# Patient Record
Sex: Male | Born: 1945
Health system: Southern US, Community
[De-identification: ages and names within clinical notes are randomized; demographics above are authoritative.]

## PROBLEM LIST (undated history)

## (undated) DIAGNOSIS — R519 Headache, unspecified: Secondary | ICD-10-CM

## (undated) DIAGNOSIS — E7800 Pure hypercholesterolemia, unspecified: Secondary | ICD-10-CM

## (undated) DIAGNOSIS — I10 Essential (primary) hypertension: Secondary | ICD-10-CM

## (undated) DIAGNOSIS — K4090 Unilateral inguinal hernia, without obstruction or gangrene, not specified as recurrent: Secondary | ICD-10-CM

## (undated) DIAGNOSIS — H548 Legal blindness, as defined in USA: Secondary | ICD-10-CM

## (undated) DIAGNOSIS — M1990 Unspecified osteoarthritis, unspecified site: Secondary | ICD-10-CM

---

## 2009-04-21 HISTORY — PX: HERNIA REPAIR: SHX51

## 2012-04-26 DIAGNOSIS — M169 Osteoarthritis of hip, unspecified: Secondary | ICD-10-CM | POA: Diagnosis not present

## 2012-04-26 DIAGNOSIS — Z23 Encounter for immunization: Secondary | ICD-10-CM | POA: Diagnosis not present

## 2012-05-05 DIAGNOSIS — M25559 Pain in unspecified hip: Secondary | ICD-10-CM | POA: Diagnosis not present

## 2012-05-05 DIAGNOSIS — M169 Osteoarthritis of hip, unspecified: Secondary | ICD-10-CM | POA: Diagnosis not present

## 2012-05-06 ENCOUNTER — Encounter (HOSPITAL_COMMUNITY): Payer: Self-pay | Admitting: *Deleted

## 2012-05-06 ENCOUNTER — Other Ambulatory Visit (HOSPITAL_COMMUNITY): Payer: Self-pay | Admitting: Orthopaedic Surgery

## 2012-05-06 ENCOUNTER — Other Ambulatory Visit (HOSPITAL_COMMUNITY): Payer: Self-pay

## 2012-05-06 ENCOUNTER — Encounter (HOSPITAL_COMMUNITY): Payer: Self-pay | Admitting: Pharmacy Technician

## 2012-05-06 NOTE — Progress Notes (Signed)
Need surgery orders for 1-17 surgery asap. thanks

## 2012-05-07 ENCOUNTER — Encounter (HOSPITAL_COMMUNITY): Payer: Self-pay | Admitting: Orthopedic Surgery

## 2012-05-07 ENCOUNTER — Encounter (HOSPITAL_COMMUNITY): Payer: Self-pay | Admitting: Anesthesiology

## 2012-05-07 ENCOUNTER — Inpatient Hospital Stay (HOSPITAL_COMMUNITY)
Admission: RE | Admit: 2012-05-07 | Discharge: 2012-05-10 | DRG: 470 | Disposition: A | Discharge status: Home Health | Payer: Medicare Other | Source: Ambulatory Visit | Attending: Orthopaedic Surgery | Admitting: Orthopaedic Surgery

## 2012-05-07 ENCOUNTER — Inpatient Hospital Stay (HOSPITAL_COMMUNITY): Payer: Medicare Other

## 2012-05-07 ENCOUNTER — Encounter (HOSPITAL_COMMUNITY): Payer: Self-pay | Admitting: *Deleted

## 2012-05-07 ENCOUNTER — Encounter (HOSPITAL_COMMUNITY)
Admission: RE | Disposition: A | Discharge status: Home Health | Payer: Self-pay | Source: Ambulatory Visit | Attending: Orthopaedic Surgery

## 2012-05-07 ENCOUNTER — Inpatient Hospital Stay (HOSPITAL_COMMUNITY): Payer: Medicare Other | Admitting: Anesthesiology

## 2012-05-07 DIAGNOSIS — Z01818 Encounter for other preprocedural examination: Secondary | ICD-10-CM | POA: Diagnosis not present

## 2012-05-07 DIAGNOSIS — M169 Osteoarthritis of hip, unspecified: Secondary | ICD-10-CM

## 2012-05-07 DIAGNOSIS — Z471 Aftercare following joint replacement surgery: Secondary | ICD-10-CM | POA: Diagnosis not present

## 2012-05-07 DIAGNOSIS — M1610 Unilateral primary osteoarthritis, unspecified hip: Principal | ICD-10-CM | POA: Diagnosis present

## 2012-05-07 DIAGNOSIS — D62 Acute posthemorrhagic anemia: Secondary | ICD-10-CM | POA: Diagnosis not present

## 2012-05-07 DIAGNOSIS — E7800 Pure hypercholesterolemia, unspecified: Secondary | ICD-10-CM | POA: Diagnosis present

## 2012-05-07 DIAGNOSIS — I10 Essential (primary) hypertension: Secondary | ICD-10-CM | POA: Diagnosis not present

## 2012-05-07 DIAGNOSIS — M25559 Pain in unspecified hip: Secondary | ICD-10-CM | POA: Diagnosis not present

## 2012-05-07 DIAGNOSIS — Z96649 Presence of unspecified artificial hip joint: Secondary | ICD-10-CM | POA: Diagnosis not present

## 2012-05-07 HISTORY — PX: TOTAL HIP ARTHROPLASTY: SHX124

## 2012-05-07 HISTORY — DX: Essential (primary) hypertension: I10

## 2012-05-07 HISTORY — DX: Unspecified osteoarthritis, unspecified site: M19.90

## 2012-05-07 HISTORY — DX: Unilateral inguinal hernia, without obstruction or gangrene, not specified as recurrent: K40.90

## 2012-05-07 HISTORY — DX: Pure hypercholesterolemia, unspecified: E78.00

## 2012-05-07 LAB — SURGICAL PCR SCREEN
MRSA, PCR: NEGATIVE
Staphylococcus aureus: NEGATIVE

## 2012-05-07 LAB — URINALYSIS, ROUTINE W REFLEX MICROSCOPIC
Glucose, UA: NEGATIVE mg/dL
Hgb urine dipstick: NEGATIVE
Protein, ur: NEGATIVE mg/dL
Specific Gravity, Urine: 1.03 (ref 1.005–1.030)

## 2012-05-07 LAB — CBC
HCT: 41.8 % (ref 39.0–52.0)
Hemoglobin: 14.4 g/dL (ref 13.0–17.0)
MCV: 90.1 fL (ref 78.0–100.0)
RBC: 4.64 MIL/uL (ref 4.22–5.81)
RDW: 12.8 % (ref 11.5–15.5)
WBC: 6.9 10*3/uL (ref 4.0–10.5)

## 2012-05-07 LAB — ABO/RH: ABO/RH(D): O NEG

## 2012-05-07 LAB — BASIC METABOLIC PANEL
CO2: 26 mEq/L (ref 19–32)
Chloride: 101 mEq/L (ref 96–112)
Creatinine, Ser: 0.78 mg/dL (ref 0.50–1.35)
GFR calc Af Amer: >90 mL/min (ref >90)
Potassium: 3.6 mEq/L (ref 3.5–5.1)
Sodium: 137 mEq/L (ref 135–145)

## 2012-05-07 LAB — PROTIME-INR: INR: 1.03 (ref 0.00–1.49)

## 2012-05-07 LAB — APTT: aPTT: 35 seconds (ref 24–37)

## 2012-05-07 SURGERY — ARTHROPLASTY, HIP, TOTAL, ANTERIOR APPROACH
Anesthesia: General | Site: Hip | Laterality: Left | Wound class: Clean

## 2012-05-07 MED ORDER — OXYCODONE HCL 5 MG PO TABS
5.0000 mg | ORAL_TABLET | ORAL | Status: DC | PRN
  Administered 2012-05-07 – 2012-05-08 (×4): 10 mg via ORAL
  Administered 2012-05-09: 5 mg via ORAL
  Filled 2012-05-07: qty 1
  Filled 2012-05-07: qty 2
  Filled 2012-05-07 (×2): qty 1
  Filled 2012-05-07 (×2): qty 2

## 2012-05-07 MED ORDER — HYDROMORPHONE HCL PF 1 MG/ML IJ SOLN: 1.0000 mg | INTRAMUSCULAR | Status: DC | PRN

## 2012-05-07 MED ORDER — METHOCARBAMOL 500 MG PO TABS
500.0000 mg | ORAL_TABLET | Freq: Four times a day (QID) | ORAL | Status: DC | PRN
  Administered 2012-05-08 – 2012-05-09 (×2): 500 mg via ORAL
  Filled 2012-05-07 (×2): qty 1

## 2012-05-07 MED ORDER — MIDAZOLAM HCL 5 MG/5ML IJ SOLN
INTRAMUSCULAR | Status: DC | PRN
  Administered 2012-05-07: 2 mg via INTRAVENOUS

## 2012-05-07 MED ORDER — LACTATED RINGERS IV SOLN
INTRAVENOUS | Status: DC | PRN
  Administered 2012-05-07 (×2): via INTRAVENOUS

## 2012-05-07 MED ORDER — DOCUSATE SODIUM 100 MG PO CAPS
100.0000 mg | ORAL_CAPSULE | Freq: Two times a day (BID) | ORAL | Status: DC
  Administered 2012-05-07 – 2012-05-09 (×5): 100 mg via ORAL

## 2012-05-07 MED ORDER — MENTHOL 3 MG MT LOZG: 1.0000 | LOZENGE | OROMUCOSAL | Status: DC | PRN

## 2012-05-07 MED ORDER — ZOLPIDEM TARTRATE 5 MG PO TABS: 5.0000 mg | ORAL_TABLET | Freq: Every evening | ORAL | Status: DC | PRN

## 2012-05-07 MED ORDER — ONDANSETRON HCL 4 MG/2ML IJ SOLN
INTRAMUSCULAR | Status: DC | PRN
  Administered 2012-05-07: 4 mg via INTRAVENOUS

## 2012-05-07 MED ORDER — ACETAMINOPHEN 10 MG/ML IV SOLN: 1000.0000 mg | Freq: Once | INTRAVENOUS | Status: DC | PRN

## 2012-05-07 MED ORDER — HETASTARCH-ELECTROLYTES 6 % IV SOLN
INTRAVENOUS | Status: DC | PRN
  Administered 2012-05-07: 13:00:00 via INTRAVENOUS

## 2012-05-07 MED ORDER — FERROUS SULFATE 325 (65 FE) MG PO TABS
325.0000 mg | ORAL_TABLET | Freq: Three times a day (TID) | ORAL | Status: DC
  Administered 2012-05-07 – 2012-05-10 (×8): 325 mg via ORAL
  Filled 2012-05-07 (×11): qty 1

## 2012-05-07 MED ORDER — METOPROLOL SUCCINATE ER 50 MG PO TB24
50.0000 mg | ORAL_TABLET | Freq: Every day | ORAL | Status: DC
  Administered 2012-05-08 – 2012-05-09 (×2): 50 mg via ORAL
  Filled 2012-05-07 (×3): qty 1

## 2012-05-07 MED ORDER — FENTANYL CITRATE 0.05 MG/ML IJ SOLN
INTRAMUSCULAR | Status: DC | PRN
  Administered 2012-05-07: 50 ug via INTRAVENOUS
  Administered 2012-05-07: 100 ug via INTRAVENOUS
  Administered 2012-05-07 (×2): 50 ug via INTRAVENOUS

## 2012-05-07 MED ORDER — PROPOFOL 10 MG/ML IV BOLUS
INTRAVENOUS | Status: DC | PRN
  Administered 2012-05-07: 150 mg via INTRAVENOUS

## 2012-05-07 MED ORDER — MEPERIDINE HCL 50 MG/ML IJ SOLN: 6.2500 mg | INTRAMUSCULAR | Status: DC | PRN

## 2012-05-07 MED ORDER — OXYCODONE HCL 5 MG/5ML PO SOLN
5.0000 mg | Freq: Once | ORAL | Status: DC | PRN
  Filled 2012-05-07: qty 5

## 2012-05-07 MED ORDER — ACETAMINOPHEN 10 MG/ML IV SOLN
INTRAVENOUS | Status: DC | PRN
  Administered 2012-05-07: 1000 mg via INTRAVENOUS

## 2012-05-07 MED ORDER — DIPHENHYDRAMINE HCL 12.5 MG/5ML PO ELIX: 12.5000 mg | ORAL_SOLUTION | ORAL | Status: DC | PRN

## 2012-05-07 MED ORDER — OXYCODONE HCL ER 20 MG PO T12A
20.0000 mg | EXTENDED_RELEASE_TABLET | Freq: Two times a day (BID) | ORAL | Status: DC
  Administered 2012-05-07 – 2012-05-10 (×6): 20 mg via ORAL
  Filled 2012-05-07 (×6): qty 1

## 2012-05-07 MED ORDER — ALUM & MAG HYDROXIDE-SIMETH 200-200-20 MG/5ML PO SUSP: 30.0000 mL | ORAL | Status: DC | PRN

## 2012-05-07 MED ORDER — ACETAMINOPHEN 325 MG PO TABS: 650.0000 mg | ORAL_TABLET | Freq: Four times a day (QID) | ORAL | Status: DC | PRN

## 2012-05-07 MED ORDER — PHENOL 1.4 % MT LIQD: 1.0000 | OROMUCOSAL | Status: DC | PRN

## 2012-05-07 MED ORDER — ROCURONIUM BROMIDE 100 MG/10ML IV SOLN
INTRAVENOUS | Status: DC | PRN
  Administered 2012-05-07: 10 mg via INTRAVENOUS
  Administered 2012-05-07: 40 mg via INTRAVENOUS

## 2012-05-07 MED ORDER — ONDANSETRON HCL 4 MG PO TABS: 4.0000 mg | ORAL_TABLET | Freq: Four times a day (QID) | ORAL | Status: DC | PRN

## 2012-05-07 MED ORDER — SODIUM CHLORIDE 0.9 % IV SOLN
INTRAVENOUS | Status: DC | PRN
  Administered 2012-05-07: 1000 mL

## 2012-05-07 MED ORDER — PROMETHAZINE HCL 25 MG/ML IJ SOLN: 6.2500 mg | INTRAMUSCULAR | Status: DC | PRN

## 2012-05-07 MED ORDER — SODIUM CHLORIDE 0.9 % IV SOLN
INTRAVENOUS | Status: DC
  Administered 2012-05-07: 75 mL/h via INTRAVENOUS
  Administered 2012-05-08 (×2): via INTRAVENOUS

## 2012-05-07 MED ORDER — METOCLOPRAMIDE HCL 10 MG PO TABS: 5.0000 mg | ORAL_TABLET | Freq: Three times a day (TID) | ORAL | Status: DC | PRN

## 2012-05-07 MED ORDER — CEFAZOLIN SODIUM-DEXTROSE 2-3 GM-% IV SOLR
2.0000 g | INTRAVENOUS | Status: AC
  Administered 2012-05-07: 2 g via INTRAVENOUS

## 2012-05-07 MED ORDER — ONDANSETRON HCL 4 MG/2ML IJ SOLN
4.0000 mg | Freq: Four times a day (QID) | INTRAMUSCULAR | Status: DC | PRN
  Administered 2012-05-08: 4 mg via INTRAVENOUS
  Filled 2012-05-07: qty 2

## 2012-05-07 MED ORDER — ACETAMINOPHEN 650 MG RE SUPP: 650.0000 mg | Freq: Four times a day (QID) | RECTAL | Status: DC | PRN

## 2012-05-07 MED ORDER — STERILE WATER FOR IRRIGATION IR SOLN
Status: DC | PRN
  Administered 2012-05-07: 3000 mL

## 2012-05-07 MED ORDER — CEFAZOLIN SODIUM-DEXTROSE 2-3 GM-% IV SOLR
2.0000 g | Freq: Four times a day (QID) | INTRAVENOUS | Status: AC
  Administered 2012-05-07 – 2012-05-08 (×2): 2 g via INTRAVENOUS
  Filled 2012-05-07 (×2): qty 50

## 2012-05-07 MED ORDER — HYDROMORPHONE HCL PF 1 MG/ML IJ SOLN: 0.2500 mg | INTRAMUSCULAR | Status: DC | PRN

## 2012-05-07 MED ORDER — METHOCARBAMOL 100 MG/ML IJ SOLN
500.0000 mg | Freq: Four times a day (QID) | INTRAVENOUS | Status: DC | PRN
  Filled 2012-05-07: qty 5

## 2012-05-07 MED ORDER — MUPIROCIN 2 % EX OINT
TOPICAL_OINTMENT | Freq: Two times a day (BID) | CUTANEOUS | Status: DC
  Administered 2012-05-07: 1 via NASAL
  Filled 2012-05-07: qty 22

## 2012-05-07 MED ORDER — ASPIRIN EC 325 MG PO TBEC
325.0000 mg | DELAYED_RELEASE_TABLET | Freq: Every day | ORAL | Status: DC
  Administered 2012-05-08 – 2012-05-10 (×3): 325 mg via ORAL
  Filled 2012-05-07 (×4): qty 1

## 2012-05-07 MED ORDER — OXYCODONE HCL 5 MG PO TABS: 5.0000 mg | ORAL_TABLET | Freq: Once | ORAL | Status: DC | PRN

## 2012-05-07 MED ORDER — LIDOCAINE HCL (CARDIAC) 20 MG/ML IV SOLN
INTRAVENOUS | Status: DC | PRN
  Administered 2012-05-07: 100 mg via INTRAVENOUS

## 2012-05-07 MED ORDER — METOCLOPRAMIDE HCL 5 MG/ML IJ SOLN: 5.0000 mg | Freq: Three times a day (TID) | INTRAMUSCULAR | Status: DC | PRN

## 2012-05-07 SURGICAL SUPPLY — 38 items
BAG ZIPLOCK 12X15 (MISCELLANEOUS) ×4 IMPLANT
BLADE SAW SGTL 18X1.27X75 (BLADE) ×2 IMPLANT
CLOTH BEACON ORANGE TIMEOUT ST (SAFETY) ×2 IMPLANT
DERMABOND ADVANCED (GAUZE/BANDAGES/DRESSINGS) ×1
DERMABOND ADVANCED .7 DNX12 (GAUZE/BANDAGES/DRESSINGS) ×1 IMPLANT
DRAPE C-ARM 42X72 X-RAY (DRAPES) ×2 IMPLANT
DRAPE STERI IOBAN 125X83 (DRAPES) ×2 IMPLANT
DRAPE U-SHAPE 47X51 STRL (DRAPES) ×6 IMPLANT
DRSG AQUACEL AG ADV 3.5X10 (GAUZE/BANDAGES/DRESSINGS) ×2 IMPLANT
DURAPREP 26ML APPLICATOR (WOUND CARE) ×2 IMPLANT
ELECT BLADE TIP CTD 4 INCH (ELECTRODE) ×2 IMPLANT
ELECT REM PT RETURN 9FT ADLT (ELECTROSURGICAL) ×2
ELECTRODE REM PT RTRN 9FT ADLT (ELECTROSURGICAL) ×1 IMPLANT
FACESHIELD LNG OPTICON STERILE (SAFETY) ×8 IMPLANT
GLOVE BIO SURGEON STRL SZ7 (GLOVE) ×2 IMPLANT
GLOVE BIO SURGEON STRL SZ7.5 (GLOVE) ×2 IMPLANT
GLOVE BIOGEL PI IND STRL 7.5 (GLOVE) IMPLANT
GLOVE BIOGEL PI IND STRL 8 (GLOVE) ×1 IMPLANT
GLOVE BIOGEL PI INDICATOR 7.5 (GLOVE)
GLOVE BIOGEL PI INDICATOR 8 (GLOVE) ×1
GLOVE ECLIPSE 7.0 STRL STRAW (GLOVE) ×2 IMPLANT
GOWN STRL REIN XL XLG (GOWN DISPOSABLE) ×4 IMPLANT
HANDPIECE INTERPULSE COAX TIP (DISPOSABLE) ×1
HEAD CERAMIC 36 PLUS 8.5 12 14 (Hips) IMPLANT
KIT BASIN OR (CUSTOM PROCEDURE TRAY) ×2 IMPLANT
PACK TOTAL JOINT (CUSTOM PROCEDURE TRAY) ×2 IMPLANT
PADDING CAST COTTON 6X4 STRL (CAST SUPPLIES) ×2 IMPLANT
SET HNDPC FAN SPRY TIP SCT (DISPOSABLE) ×1 IMPLANT
SPONGE LAP 18X18 X RAY DECT (DISPOSABLE) ×2 IMPLANT
SUT ETHIBOND NAB CT1 #1 30IN (SUTURE) ×4 IMPLANT
SUT MNCRL AB 4-0 PS2 18 (SUTURE) ×2 IMPLANT
SUT VIC AB 1 CT1 36 (SUTURE) ×4 IMPLANT
SUT VIC AB 2-0 CT1 27 (SUTURE) ×2
SUT VIC AB 2-0 CT1 TAPERPNT 27 (SUTURE) ×2 IMPLANT
SUT VLOC 180 0 24IN GS25 (SUTURE) ×2 IMPLANT
TOWEL OR 17X26 10 PK STRL BLUE (TOWEL DISPOSABLE) ×4 IMPLANT
TOWEL OR NON WOVEN STRL DISP B (DISPOSABLE) ×2 IMPLANT
TRAY FOLEY CATH 14FRSI W/METER (CATHETERS) ×2 IMPLANT

## 2012-05-07 NOTE — Anesthesia Postprocedure Evaluation (Signed)
Anesthesia Post Note  Patient: Lee Torres  Procedure(s) Performed: Procedure(s) (LRB): TOTAL HIP ARTHROPLASTY ANTERIOR APPROACH (Left)  Anesthesia type: General  Patient location: PACU  Post pain: Pain level controlled  Post assessment: Post-op Vital signs reviewed  Last Vitals: BP 116/74  Pulse 60  Temp 36.8 C (Oral)  Resp 14  Ht 5\' 10"  (1.778 m)  Wt 212 lb 6 oz (96.333 kg)  BMI 30.47 kg/m2  SpO2 98%  Post vital signs: Reviewed  Level of consciousness: sedated  Complications: No apparent anesthesia complications

## 2012-05-07 NOTE — H&P (Signed)
TOTAL HIP ADMISSION H&P  Patient is admitted for left total hip arthroplasty.  Subjective:  Chief Complaint: left hip pain  HPI: Lee Torres, 67 y.o. male, has a history of pain and functional disability in the left hip(s) due to arthritis and patient has failed non-surgical conservative treatments for greater than 12 weeks to include use of assistive devices, weight reduction as appropriate and activity modification.  Onset of symptoms was gradual starting >10 years ago with gradually worsening course since that time.The patient noted no past surgery on the left hip(s).  Patient currently rates pain in the left hip at 10 out of 10 with activity. Patient has night pain, worsening of pain with activity and weight bearing, trendelenberg gait, pain that interfers with activities of daily living, pain with passive range of motion and crepitus. Patient has evidence of subchondral cysts, subchondral sclerosis, periarticular osteophytes and joint space narrowing by imaging studies. This condition presents safety issues increasing the risk of falls.  There is no current active infection.  Patient Active Problem List   Diagnosis Date Noted  . Degenerative arthritis of hip 05/07/2012   Past Medical History  Diagnosis Date  . Hypertension   . Elevated cholesterol   . Arthritis     oa  . Inguinal hernia     left side, repaired unsuccessfully 2 yrs ago    Past Surgical History  Procedure Date  . Hernia repair 2011    No prescriptions prior to admission   No Known Allergies  History  Substance Use Topics  . Smoking status: Never Smoker   . Smokeless tobacco: Current User  . Alcohol Use: No    History reviewed. No pertinent family history.   Review of Systems  Musculoskeletal: Positive for joint pain.  All other systems reviewed and are negative.    Objective:  Physical Exam  Constitutional: He is oriented to person, place, and time. He appears well-developed and well-nourished.    HENT:  Head: Normocephalic and atraumatic.  Eyes: EOM are normal. Pupils are equal, round, and reactive to light.  Neck: Normal range of motion. Neck supple.  Cardiovascular: Normal rate and regular rhythm.   Respiratory: Effort normal and breath sounds normal.  GI: Soft. Bowel sounds are normal.  Musculoskeletal:       Left hip: He exhibits decreased range of motion, decreased strength, bony tenderness, crepitus and deformity.  Neurological: He is alert and oriented to person, place, and time.  Skin: Skin is warm and dry.  Psychiatric: He has a normal mood and affect.    Vital signs in last 24 hours:    Labs:   There is no height or weight on file to calculate BMI.   Imaging Review Plain radiographs demonstrate severe degenerative joint disease of the left hip(s). The bone quality appears to be good for age and reported activity level.  Assessment/Plan:  End stage arthritis, left hip(s)  The patient history, physical examination, clinical judgement of the provider and imaging studies are consistent with end stage degenerative joint disease of the left hip(s) and total hip arthroplasty is deemed medically necessary. The treatment options including medical management, injection therapy, arthroscopy and arthroplasty were discussed at length. The risks and benefits of total hip arthroplasty were presented and reviewed. The risks due to aseptic loosening, infection, stiffness, dislocation/subluxation,  thromboembolic complications and other imponderables were discussed.  The patient acknowledged the explanation, agreed to proceed with the plan and consent was signed. Patient is being admitted for inpatient treatment for  surgery, pain control, PT, OT, prophylactic antibiotics, VTE prophylaxis, progressive ambulation and ADL's and discharge planning.The patient is planning to be discharged home with home health services

## 2012-05-07 NOTE — Brief Op Note (Signed)
05/07/2012  2:25 PM  PATIENT:  Lee Torres  67 y.o. male  PRE-OPERATIVE DIAGNOSIS:  Severe Osteoarthritis Left Hip  POST-OPERATIVE DIAGNOSIS:  Severe Osteoarthritis Left Hip  PROCEDURE:  Procedure(s) (LRB) with comments: TOTAL HIP ARTHROPLASTY ANTERIOR APPROACH (Left)  SURGEON:  Surgeon(s) and Role:    * Kathryne Hitch, MD - Primary  PHYSICIAN ASSISTANT:   Maud Deed, PA-C  ASSISTANTS: none   ANESTHESIA:   general  EBL:  Total I/O In: 2500 [I.V.:2000; IV Piggyback:500] Out: 1975 [Urine:75; Blood:1900]  BLOOD ADMINISTERED:none  DRAINS: none   LOCAL MEDICATIONS USED:  NONE  SPECIMEN:  No Specimen  DISPOSITION OF SPECIMEN:  N/A  COUNTS:  YES  TOURNIQUET:  * No tourniquets in log *  DICTATION: .Other Dictation: Dictation Number [id number]  PLAN OF CARE: Admit to inpatient   PATIENT DISPOSITION:  PACU - hemodynamically stable.   Delay start of Pharmacological VTE agent (>24hrs) due to surgical blood loss or risk of bleeding: no

## 2012-05-07 NOTE — Anesthesia Preprocedure Evaluation (Addendum)
Anesthesia Evaluation  Patient identified by MRN, date of birth, ID band Patient awake    Reviewed: Allergy & Precautions, H&P , NPO status , Patient's Chart, lab work & pertinent test results  Airway       Dental  (+) Dental Advisory Given, Poor Dentition, Missing and Chipped   Pulmonary neg pulmonary ROS,          Cardiovascular hypertension, Pt. on medications     Neuro/Psych negative neurological ROS  negative psych ROS   GI/Hepatic negative GI ROS, Neg liver ROS,   Endo/Other  negative endocrine ROS  Renal/GU negative Renal ROS     Musculoskeletal  (+) Arthritis -, Osteoarthritis,    Abdominal (+) + obese,   Peds  Hematology negative hematology ROS (+)   Anesthesia Other Findings   Reproductive/Obstetrics                          Anesthesia Physical Anesthesia Plan  ASA: II  Anesthesia Plan:    Post-op Pain Management:    Induction:   Airway Management Planned:   Additional Equipment:   Intra-op Plan:   Post-operative Plan:   Informed Consent: I have reviewed the patients History and Physical, chart, labs and discussed the procedure including the risks, benefits and alternatives for the proposed anesthesia with the patient or authorized representative who has indicated his/her understanding and acceptance.   Dental advisory given  Plan Discussed with: CRNA  Anesthesia Plan Comments:         Anesthesia Quick Evaluation

## 2012-05-07 NOTE — Transfer of Care (Signed)
Immediate Anesthesia Transfer of Care Note  Patient: Lee Torres  Procedure(s) Performed: Procedure(s) (LRB) with comments: TOTAL HIP ARTHROPLASTY ANTERIOR APPROACH (Left)  Patient Location: PACU  Anesthesia Type:General  Level of Consciousness: awake, alert  and oriented  Airway & Oxygen Therapy: Patient Spontanous Breathing and Patient connected to face mask oxygen  Post-op Assessment: Report given to PACU RN and Post -op Vital signs reviewed and stable  Post vital signs: Reviewed and stable  Complications: No apparent anesthesia complications

## 2012-05-08 LAB — CBC
HCT: 30.5 % — ABNORMAL LOW (ref 39.0–52.0)
Hemoglobin: 10.5 g/dL — ABNORMAL LOW (ref 13.0–17.0)
MCH: 30.6 pg (ref 26.0–34.0)
MCV: 88.9 fL (ref 78.0–100.0)
RBC: 3.43 MIL/uL — ABNORMAL LOW (ref 4.22–5.81)
WBC: 8 10*3/uL (ref 4.0–10.5)

## 2012-05-08 LAB — BASIC METABOLIC PANEL
CO2: 30 mEq/L (ref 19–32)
Calcium: 8.1 mg/dL — ABNORMAL LOW (ref 8.4–10.5)
Chloride: 99 mEq/L (ref 96–112)
Creatinine, Ser: 0.84 mg/dL (ref 0.50–1.35)
Glucose, Bld: 113 mg/dL — ABNORMAL HIGH (ref 70–99)

## 2012-05-08 LAB — TYPE AND SCREEN
Antibody Screen: NEGATIVE
Unit division: 0

## 2012-05-08 MED ORDER — POLYETHYLENE GLYCOL 3350 17 G PO PACK
17.0000 g | PACK | Freq: Two times a day (BID) | ORAL | Status: DC
  Administered 2012-05-08 – 2012-05-09 (×4): 17 g via ORAL

## 2012-05-08 NOTE — Evaluation (Signed)
Physical Therapy Evaluation Patient Details Name: Lee Torres MRN: [id number] DOB: 1945-05-06 Today's Date: 05/08/2012 Time: 3244-0102 PT Time Calculation (min): 30 min  PT Assessment / Plan / Recommendation Clinical Impression  67 yo male s/p L direct anterior THA. On eval pt was Min-Mod assist for mobility. Increased time to complete tasks. Limited by pain. Recommend HHPTand 24 hour supervision initially.     PT Assessment  Patient needs continued PT services    Follow Up Recommendations  Home health PT;Supervision/Assistance - 24 hour    Does the patient have the potential to tolerate intense rehabilitation      Barriers to Discharge        Equipment Recommendations  None recommended by PT    Recommendations for Other Services OT consult   Frequency 7X/week    Precautions / Restrictions Precautions Precautions: Fall Precaution Comments: Direct anterior approach BUT MD wrote to avoid excessive external rotation Restrictions Weight Bearing Restrictions: No LLE Weight Bearing: Weight bearing as tolerated   Pertinent Vitals/Pain 4/10 at rest; "14" with activity L hip      Mobility  Bed Mobility Bed Mobility: Supine to Sit Supine to Sit: 3: Mod assist Supine to Sit: Patient Percentage: 50% Sitting - Scoot to Edge of Bed: 4: Min guard Details for Bed Mobility Assistance: VCs safety, technique, hand placement. Assist for L LE off bed and trunk to upright. Increased time. Utilized bedpad to assist with scooting, positioning. Transfers Transfers: Sit to Stand;Stand to Sit Sit to Stand: 4: Min guard;From elevated surface Stand to Sit: 4: Min assist;To chair/3-in-1;With upper extremity assist;With armrests Details for Transfer Assistance: Highly elevated bed to rise to standing. VCS safety, technique, hand placement. Assist to control descent Ambulation/Gait Ambulation/Gait Assistance: 4: Min guard Ambulation Distance (Feet): 30 Feet Assistive device: Rolling  walker Ambulation/Gait Assistance Details: VCs safety, technique, sequence, posture. Slow gait speed. Fatigues easily and limited by pain.  Gait Pattern: Step-to pattern;Antalgic;Trunk flexed;Decreased stride length;Decreased step length - left    Shoulder Instructions     Exercises     PT Diagnosis: Difficulty walking;Abnormality of gait;Acute pain  PT Problem List: Decreased strength;Decreased range of motion;Decreased activity tolerance;Decreased mobility;Pain;Decreased knowledge of precautions;Decreased knowledge of use of DME PT Treatment Interventions: DME instruction;Gait training;Stair training;Functional mobility training;Therapeutic activities;Therapeutic exercise;Patient/family education   PT Goals Acute Rehab PT Goals PT Goal Formulation: With patient Time For Goal Achievement: 05/15/12 Potential to Achieve Goals: Good Pt will go Supine/Side to Sit: with supervision PT Goal: Supine/Side to Sit - Progress: Goal set today Pt will go Sit to Supine/Side: with supervision PT Goal: Sit to Supine/Side - Progress: Goal set today Pt will go Sit to Stand: with supervision PT Goal: Sit to Stand - Progress: Goal set today Pt will Ambulate: 51 - 150 feet;with supervision;with least restrictive assistive device PT Goal: Ambulate - Progress: Goal set today Pt will Go Up / Down Stairs: 3-5 stairs;with least restrictive assistive device;with rail(s) PT Goal: Up/Down Stairs - Progress: Goal set today  Visit Information  Last PT Received On: 05/08/12 Assistance Needed: +1 PT/OT Co-Evaluation/Treatment: Yes    Subjective Data  Subjective: "This leg hurts.Marland Kitchen...wooooo" Patient Stated Goal: HOme. Less pain   Prior Functioning  Home Living Lives With: Spouse Available Help at Discharge: Family;Available 24 hours/day Type of Home: House Home Access: Stairs to enter [employer] of Steps: 4 Entrance Stairs-Rails: Can reach both Home Layout: One level Bathroom Shower/Tub:  Tub/shower unit;Door [employer] Toilet: Standard Bathroom Accessibility: Yes How Accessible: Accessible via walker Home Adaptive  Equipment: Walker - rolling;Bedside commode/3-in-1;Tub transfer bench;Crutches;Walker - four wheeled Prior Function Level of Independence: Independent with assistive device(s) (crutches/cane) Able to Take Stairs?: Yes Driving: Yes Vocation: Unemployed Communication Communication: No difficulties Dominant Hand: Right    Cognition  Overall Cognitive Status: Appears within functional limits for tasks assessed/performed Arousal/Alertness: Awake/alert Orientation Level: Appears intact for tasks assessed Behavior During Session: [facility] for tasks performed    Extremity/Trunk Assessment Right Upper Extremity Assessment RUE ROM/Strength/Tone: Within functional levels Left Upper Extremity Assessment LUE ROM/Strength/Tone: Within functional levels Right Lower Extremity Assessment RLE ROM/Strength/Tone: [facility] for tasks assessed Left Lower Extremity Assessment LLE ROM/Strength/Tone: Deficits;Unable to fully assess;Due to pain LLE ROM/Strength/Tone Deficits: Limited hip ROM with bed mobility noted Trunk Assessment Trunk Assessment: Normal   Balance    End of Session PT - End of Session Equipment Utilized During Treatment: Gait belt Activity Tolerance: Patient limited by pain;Patient limited by fatigue Patient left: in chair;with call bell/phone within reach;with family/visitor present  GP     Rebeca Alert [facility] 05/08/2012, 12:12 PM [phone]

## 2012-05-08 NOTE — Plan of Care (Signed)
Problem: Phase I Progression Outcomes Goal: Dangle or out of bed evening of surgery Outcome: Not Met (add Reason) Blood transfusion in progress. Will get out of bed with therapy.

## 2012-05-08 NOTE — Progress Notes (Signed)
Pt c/o feeling strong need to void; abd distended over bladder; no drainage through foley. Irrigated w/10cc sterile NS. Immediate return of 500+ cc urine. Pt states feeling better. Harveen Flesch, [employer]

## 2012-05-08 NOTE — Evaluation (Signed)
Occupational Therapy Evaluation Patient Details Name: Lee Torres MRN: [id number] DOB: [birth year] Today's Date: 05/08/2012 Time: 2542-7062 OT Time Calculation (min): 35 min  OT Assessment / Plan / Recommendation Clinical Impression  This 67 yo male s/p left hip direct anterior (avoid excessive external rotation) presents to acute OT with problems below. Will benefit from acute OT without need for follow up.    OT Assessment  Patient needs continued OT Services    Follow Up Recommendations  No OT follow up    Barriers to Discharge None    Equipment Recommendations  None recommended by OT       Frequency  Min 2X/week    Precautions / Restrictions Precautions Precautions: Fall (avoid excessive external rotation) Restrictions Weight Bearing Restrictions: Yes LLE Weight Bearing: Weight bearing as tolerated   Pertinent Vitals/Pain 4/10 at rest, 14/10 with ambulation    ADL  Eating/Feeding: Simulated;Independent Where Assessed - Eating/Feeding: Edge of bed Grooming: Simulated;Set up Where Assessed - Grooming: Unsupported sitting Upper Body Bathing: Simulated;Set up Where Assessed - Upper Body Bathing: Unsupported sitting Lower Body Bathing: Simulated;Maximal assistance Where Assessed - Lower Body Bathing: Supported sit to stand Upper Body Dressing: Simulated;Set up Where Assessed - Upper Body Dressing: Unsupported sitting Lower Body Dressing: Simulated;+1 Total assistance Where Assessed - Lower Body Dressing: Supported sit to stand Toilet Transfer: Simulated;Minimal assistance Toilet Transfer Method: Sit to Barista:  (raised bed, out into hallway) Toileting - Clothing Manipulation and Hygiene: Simulated;Moderate assistance Where Assessed - Toileting Clothing Manipulation and Hygiene: Standing Equipment Used: Rolling walker;Gait belt Transfers/Ambulation Related to ADLs: Min guard A sit to stand from raised bed, Min A stand to sit to recliner,  suspect up from recliner will be a Mod A    OT Diagnosis: Generalized weakness;Acute pain  OT Problem List: Pain;Decreased knowledge of use of DME or AE;Decreased knowledge of precautions;Impaired balance (sitting and/or standing) OT Treatment Interventions: Self-care/ADL training;DME and/or AE instruction;Patient/family education;Balance training   OT Goals Acute Rehab OT Goals OT Goal Formulation: With patient Time For Goal Achievement: 05/15/12 Potential to Achieve Goals: Good ADL Goals Pt Will Transfer to Toilet: with supervision;Ambulation;with DME;3-in-1 ADL Goal: Toilet Transfer - Progress: Goal set today Pt Will Perform Toileting - Clothing Manipulation: with supervision;Standing ADL Goal: Toileting - Clothing Manipulation - Progress: Goal set today Pt Will Perform Toileting - Hygiene: with supervision;Sit to stand from 3-in-1/toilet ADL Goal: Toileting - Hygiene - Progress: Goal set today Pt Will Perform Tub/Shower Transfer: with min assist;Ambulation;with DME;Transfer tub bench ADL Goal: Tub/Shower Transfer - Progress: Goal set today Miscellaneous OT Goals Miscellaneous OT Goal #1: Pt will be Supervision in and OOB for BADLs OT Goal: Miscellaneous Goal #1 - Progress: Goal set today Miscellaneous OT Goal #2: Pt will verbalize and/or return demo the use of AE for LB ADLs OT Goal: Miscellaneous Goal #2 - Progress: Goal set today  Visit Information  Last OT Received On: 05/08/12 Assistance Needed: +1 PT/OT Co-Evaluation/Treatment: Yes (partial)    Subjective Data  Subjective: Whew this hurts   Prior Functioning     Home Living Lives With: Spouse Available Help at Discharge: Family;Available 24 hours/day Type of Home: House Home Access: Stairs to enter [employer] of Steps: 4 Entrance Stairs-Rails: Can reach both Home Layout: One level Bathroom Shower/Tub: Tub/shower unit;Door [employer] Toilet: Standard Bathroom Accessibility: Yes How Accessible:  Accessible via walker Home Adaptive Equipment: Walker - rolling;Bedside commode/3-in-1;Tub transfer bench;Crutches;Walker - four wheeled Prior Function Level of Independence: Independent with assistive device(s) (crutches/cane) Able  to Take Stairs?: Yes Driving: Yes Vocation: Unemployed Communication Communication: No difficulties Dominant Hand: Right            Cognition  Overall Cognitive Status: Appears within functional limits for tasks assessed/performed Arousal/Alertness: Awake/alert Orientation Level: Appears intact for tasks assessed Behavior During Session: [facility] for tasks performed    Extremity/Trunk Assessment Right Upper Extremity Assessment RUE ROM/Strength/Tone: Within functional levels Left Upper Extremity Assessment LUE ROM/Strength/Tone: Within functional levels     Mobility Bed Mobility Bed Mobility: Supine to Sit;Sitting - Scoot to Edge of Bed Supine to Sit: 1: +2 Total assist;HOB flat Supine to Sit: Patient Percentage: 50% Sitting - Scoot to Edge of Bed: 4: Min guard Transfers Transfers: Sit to Stand;Stand to Sit Sit to Stand: 4: Min guard;With upper extremity assist;From elevated surface;From bed Stand to Sit: 4: Min assist;With upper extremity assist;With armrests;To chair/3-in-1 Details for Transfer Assistance: Suspect pt will be mod A from recliner for sit to stand due to pain              End of Session OT - End of Session Equipment Utilized During Treatment: Gait belt (RW) Activity Tolerance: Patient tolerated treatment well Patient left: in chair;with call bell/phone within reach;with family/visitor present Nurse Communication: Mobility status (NT: +2 sit to stand from recliner, +1 once up on feet)       Evette Georges [phone] 05/08/2012, 11:15 AM

## 2012-05-08 NOTE — Op Note (Signed)
Lee Torres, STILTZ NO.:  [id number]  MEDICAL RECORD NO.:  [id number]  LOCATION:  1611                         FACILITY:  [facility]  PHYSICIAN:  Vanita Panda. Magnus Ivan, M.D.DATE OF BIRTH:  1945/10/03  DATE OF PROCEDURE:  05/07/2012 DATE OF DISCHARGE:                              OPERATIVE REPORT   PREOPERATIVE DIAGNOSIS:  End-stage arthritis, degenerative joint disease, left hip.  POSTOPERATIVE DIAGNOSIS:  End-stage arthritis, degenerative joint disease, left hip.  PROCEDURE:  Left total hip arthroplasty through direct anterior approach.  IMPLANTS:  DePuy Sector Gription acetabular component size 56, size 36+ 4 neutral polyethylene liner, size 15 Corail femoral component with varus offset (KLA), size 36+ 12 ceramic hip ball.  SURGEON:  Vanita Panda. Magnus Ivan, M.D.  ASSISTANT:  Wende Neighbors, P.A.  ANESTHESIA:  General.  ANTIBIOTICS:  2 g IV Ancef.  ESTIMATED BLOOD LOSS:  About 1800 mL.  COMPLICATIONS:  None.  INDICATIONS:  Mr. Rodelo is a 67 year old gentleman with severe degenerative joint disease involving his left hip.  It has bothered him about 15 years now.  When I saw him in the office, I could not even rotate his hip around, and x-rays showed complete loss of the joint space with almost essentially no joint space remaining.  He likely had some type of remote trauma, and so he understood this is going to be a quite difficult case, but not impossible.  He is already significantly short on that side as well.  He understood the risks of acute blood loss anemia and likely need for transfusion as well.  Given that he has been on [employer] for a long period of time, which always ends up with bleeding.  PROCEDURE DESCRIPTION:  After informed consent was obtained, appropriate left hip was marked.  He was brought to the operating room, and general anesthesia was obtained while he was on a stretcher.  Foley catheter was placed, and both feet  were placed inline with skeletal traction boots, and he was placed supine on the OSI Hana fracture table.  Both legs were in inline skeletal traction, but no traction applied.  Perineal post was placed as well.  We then prepped the left hip with DuraPrep and sterile drapes.  Time-out was called identifying the correct patient, correct left hip.  We then made an incision inferior and posterior to the anterior superior iliac spine and carried this obliquely down the leg. I dissected down to the tensor fascia lata.  The tensor fascia lata was divided obliquely.  We then proceeded with a direct anterior approach to the hip.  Cobra retractors were placed around the lateral neck and up underneath the rectus femoris.  A medial retractor was placed.  The lateral femoral circumflex vessels were cauterized, and I opened up the hip capsule in an L-shaped format and placed Cobra retractors within the hip capsule.  I then made my femoral neck cut, and I had to make it at the level of the lesser trochanter due to the abundant scar and osteophytes of the head.  Once I cleaned it completely this with an osteotome, it took me considerable amount of time to remove the head out of  the acetabulum.  Once I was able to do this, I placed a bent Hohmann medially and a Cobra retractor laterally.  I cleaned the acetabular debris and then began reaming from size 44 reamer up to a size 56 with all reamers placed under direct visualization.  The last reamer also placed under direct fluoroscopy, so we could obtain my depth of reaming and my inclination and my anteversion.  Once I was pleased with this, I placed the real size 56 DePuy Sector acetabular component and the real 36+ 4 neutral polyethylene liner.  Attention was then turned to the femur.  With the leg externally rotated, extended and adducted, I put a Mueller retractor medially and Hohmann retractor behind the greater trochanter.  I released the lateral  capsule and found abundant scar tissue.  I was able to use a Box cutting guide to open the femoral canal and then began broaching with a size 8 broach all the way to 15 with a size 15 broach even a little proud to fill the canal, but I had to go all the way up to a size 12 hip ball after gaining stability.  I was not comfortable.  I could have gotten more length by placing a tighter stem that was already so tight in the canal.  I was concerned about going up size wise.  Once I was pleased with what we had, we removed all trial components and placed the real Corail femoral component size 15 with varus offset and the real 36+ 12 ceramic hip ball.  We reduced this in the acetabulum.  It did gain motion, it was more stable.  He improved his leg lengths to little too.  I then used pulsatile lavage to copiously irrigate the joint.  I closed the hip capsule with #1 Ethibond suture followed by running 0 V-Loc suture in the tensor fascia lata, 2-0 Vicryl in subcutaneous tissue, and then 4-0 Monocryl and Dermabond for the skin. A well-padded dressing was applied.  He was awakened and extubated and taken to recovery room in stable condition.  All final counts were correct.  There were no complications noted.  Of note, Maud Deed, P.A. was present and assisted in its entirety and the major portions of the case.     Vanita Panda. Magnus Ivan, M.D.     CYB/MEDQ  D:  05/07/2012  T:  05/08/2012  Job:  [id number]

## 2012-05-08 NOTE — Progress Notes (Signed)
Subjective: Pt stable - pain controlled   Objective: Vital signs in last 24 hours: Temp:  [97.1 F (36.2 C)-99.5 F (37.5 C)] 97.1 F (36.2 C) (01/18 0958) Pulse Rate:  [60-86] 80  (01/18 0958) Resp:  [12-16] 16  (01/18 0958) BP: (105-127)/(66-78) 109/66 mmHg (01/18 0958) SpO2:  [94 %-100 %] 95 % (01/18 0958) Weight:  [96.163 kg (212 lb)] 96.163 kg (212 lb) (01/17 1600)  Intake/Output from previous day: 01/17 0701 - 01/18 0700 In: 5189.3 [P.O.:240; I.V.:3823.5; Blood:625.8; IV Piggyback:500] Out: 2375 [Urine:475; Blood:1900] Intake/Output this shift: Total I/O In: 340 [P.O.:240; I.V.:100] Out: 0   Exam:  Neurovascular intact Sensation intact distally Intact pulses distally Dorsiflexion/Plantar flexion intact  Labs:  Basename 05/08/12 0415 05/07/12 1000  HGB 10.5* 14.4    Basename 05/08/12 0415 05/07/12 1000  WBC 8.0 6.9  RBC 3.43* 4.64  HCT 30.5* 41.8  PLT 194 279    Basename 05/08/12 0415 05/07/12 1000  NA 133* 137  K 4.1 3.6  CL 99 101  CO2 30 26  BUN 13 16  CREATININE 0.84 0.78  GLUCOSE 113* 89  CALCIUM 8.1* 9.7    Basename 05/07/12 1000  LABPT --  INR 1.03    Assessment/Plan: Pt doing well - reports knee pain before and after hip surgery - mobilize and possible dc mon - dc foley   Lee Torres 05/08/2012, 10:30 AM

## 2012-05-08 NOTE — Progress Notes (Signed)
UOP = 50cc since 0600 per foley cath. Gaylyn Lambert, MD aware. Both BUN/Creat wnl and HgB 10.6. MD suggests no action, remove Foley in am and force fluids.

## 2012-05-08 NOTE — Progress Notes (Signed)
Physical Therapy Treatment Patient Details Name: Lee Torres MRN: [id number] DOB: 02/08/1946 Today's Date: 05/08/2012 Time: 5638-7564 PT Time Calculation (min): 35 min  PT Assessment / Plan / Recommendation Comments on Treatment Session  Mobility continues to be limited by pain. Pt also c/o leg length difference (has been using 1 shoe on L foot when ambulating).     Follow Up Recommendations  Home health PT;Supervision/Assistance - 24 hour     Does the patient have the potential to tolerate intense rehabilitation     Barriers to Discharge        Equipment Recommendations  None recommended by PT    Recommendations for Other Services OT consult  Frequency 7X/week   Plan Discharge plan remains appropriate    Precautions / Restrictions Precautions Precautions: Fall Restrictions Weight Bearing Restrictions: No LLE Weight Bearing: Weight bearing as tolerated   Pertinent Vitals/Pain Increased pain with activity/ambulation L hip    Mobility  Bed Mobility Bed Mobility: Sit to Supine Sit to Supine: 4: Min assist Details for Bed Mobility Assistance: Assist for L LE onto bed.  Transfers Transfers: Sit to Stand;Stand to Sit Sit to Stand: 4: Min guard;From chair/3-in-1;From elevated surface Stand to Sit: 4: Min assist;To bed;To elevated surface Details for Transfer Assistance: VCS safety, technique, hand placement. Assist to control descent  Ambulation/Gait Ambulation/Gait Assistance: 4: Min guard Ambulation Distance (Feet): 55 Feet Assistive device: Rolling walker Ambulation/Gait Assistance Details: VCs safety, technique, sequence, posture. Slow gait speed. Fatigues easily and limited by pain Gait Pattern: Step-to pattern;Antalgic;Trunk flexed;Decreased stride length;Decreased step length - left    Exercises Total Joint Exercises Ankle Circles/Pumps: AROM;Both;15 reps;Supine Quad Sets: AROM;Both;10 reps;Supine Heel Slides: AAROM;Left;10 reps;Supine Hip ABduction/ADduction:  AAROM;Left;5 reps;Supine   PT Diagnosis:    PT Problem List:   PT Treatment Interventions:     PT Goals Acute Rehab PT Goals Pt will go Sit to Supine/Side: with supervision PT Goal: Sit to Supine/Side - Progress: Progressing toward goal Pt will go Sit to Stand: with supervision PT Goal: Sit to Stand - Progress: Progressing toward goal Pt will Ambulate: 51 - 150 feet;with supervision;with least restrictive assistive device PT Goal: Ambulate - Progress: Progressing toward goal  Visit Information  Last PT Received On: 05/08/12 Assistance Needed: +1    Subjective Data  Subjective: "Im sorry...but this just hurts" Patient Stated Goal: home. Less pain   Cognition  Overall Cognitive Status: Appears within functional limits for tasks assessed/performed Arousal/Alertness: Awake/alert Orientation Level: Appears intact for tasks assessed Behavior During Session: [name] for tasks performed    Balance     End of Session PT - End of Session Equipment Utilized During Treatment: Gait belt Activity Tolerance: Patient limited by pain;Patient limited by fatigue Patient left: in bed;with call bell/phone within reach;with family/visitor present   GP     Rebeca Alert [facility] 05/08/2012, 4:23 PM [phone]

## 2012-05-09 LAB — CBC
HCT: 26.2 % — ABNORMAL LOW (ref 39.0–52.0)
MCH: 30.8 pg (ref 26.0–34.0)
MCV: 89.7 fL (ref 78.0–100.0)
Platelets: 192 10*3/uL (ref 150–400)
RDW: 13.8 % (ref 11.5–15.5)

## 2012-05-09 LAB — URINALYSIS, ROUTINE W REFLEX MICROSCOPIC
Bilirubin Urine: NEGATIVE
Ketones, ur: NEGATIVE mg/dL
Nitrite: NEGATIVE
Specific Gravity, Urine: 1.029 (ref 1.005–1.030)
Urobilinogen, UA: 1 mg/dL (ref 0.0–1.0)
pH: 5.5 (ref 5.0–8.0)

## 2012-05-09 LAB — URINE MICROSCOPIC-ADD ON

## 2012-05-09 MED ORDER — MAGNESIUM CITRATE PO SOLN
1.0000 | Freq: Once | ORAL | Status: AC
  Administered 2012-05-09: 1 via ORAL

## 2012-05-09 NOTE — Progress Notes (Signed)
Subjective: Pt stable - moving well  Objective: Vital signs in last 24 hours: Temp:  [97.1 F (36.2 C)-99.6 F (37.6 C)] 98.6 F (37 C) (01/19 0535) Pulse Rate:  [80-82] 82  (01/19 0535) Resp:  [16-20] 18  (01/19 0720) BP: (109-130)/(66-77) 114/77 mmHg (01/19 0535) SpO2:  [94 %-97 %] 97 % (01/19 0535)  Intake/Output from previous day: 01/18 0701 - 01/19 0700 In: 1582.5 [P.O.:720; I.V.:862.5] Out: 950 [Urine:950] Intake/Output this shift: Total I/O In: 240 [P.O.:240] Out: 15 [Urine:15]  Exam:  Neurovascular intact Sensation intact distally Intact pulses distally  Labs:  Basename 05/09/12 0458 05/08/12 0415 05/07/12 1000  HGB 9.0* 10.5* 14.4    Basename 05/09/12 0458 05/08/12 0415  WBC 9.7 8.0  RBC 2.92* 3.43*  HCT 26.2* 30.5*  PLT 192 194    Basename 05/08/12 0415 05/07/12 1000  NA 133* 137  K 4.1 3.6  CL 99 101  CO2 30 26  BUN 13 16  CREATININE 0.84 0.78  GLUCOSE 113* 89  CALCIUM 8.1* 9.7    Basename 05/07/12 1000  LABPT --  INR 1.03    Assessment/Plan: mag citrate - dc foley   Sheralee Qazi SCOTT 05/09/2012, 9:21 AM

## 2012-05-09 NOTE — Progress Notes (Signed)
Occupational Therapy Treatment Patient Details Name: Lee Torres MRN: [id number] DOB: 10-Jul-1945 Today's Date: 05/09/2012 Time: 4401-0272 OT Time Calculation (min): 45 min  OT Assessment / Plan / Recommendation Comments on Treatment Session Pt needs min cues to avoid external rotation    Follow Up Recommendations  No OT follow up    Barriers to Discharge       Equipment Recommendations  None recommended by OT    Recommendations for Other Services    Frequency     Plan Discharge plan remains appropriate    Precautions / Restrictions Precautions Precautions: Fall Precaution Comments: Direct anterior approach BUT MD wrote to avoid excessive external rotation Restrictions Weight Bearing Restrictions: No LLE Weight Bearing: Weight bearing as tolerated   Pertinent Vitals/Pain No pain in bed or sitting; min pain standing Lhip    ADL  Toilet Transfer: Min guard;Simulated (ambulated around bed to chair) Toilet Transfer Method: Sit to stand Transfers/Ambulation Related to ADLs: Pt got out of bed on L side, like he will do at home.  Min A given.  He ambulated around bed to chair with min guard, min cues for no external rotation.   ADL Comments: Educated on tub bench.  Pt does not want this.  He will wait to step into tub.  Pt does have a 3:1 commode at home.  He didn't need to use this and didn't feel he needed to practice.  Educated on AE:  pt plans to have wife assist.  She is a Lawyer.  Pt requested to stand as it felt good.  He used L shoe during functional ambulation as he L leg feels shorter to him.  Reviewed precaution of no external rotation in context of transfers, ambulation and adls.  Pt verbalizes understanding    OT Diagnosis:    OT Problem List:   OT Treatment Interventions:     OT Goals ADL Goals Pt Will Transfer to Toilet: with supervision;Ambulation;with DME;3-in-1 ADL Goal: Toilet Transfer - Progress: Progressing toward goals Pt Will Perform Tub/Shower Transfer: with  min assist;Ambulation;with DME;Transfer tub bench ADL Goal: Tub/Shower Transfer - Progress: Discontinued (comment) Miscellaneous OT Goals Miscellaneous OT Goal #1: Pt will be Supervision in and OOB for BADLs OT Goal: Miscellaneous Goal #1 - Progress: Progressing toward goals Miscellaneous OT Goal #2: Pt will verbalize and/or return demo the use of AE for LB ADLs OT Goal: Miscellaneous Goal #2 - Progress: Met  Visit Information  Last OT Received On: 05/09/12 Assistance Needed: +1    Subjective Data      Prior Functioning       Cognition  Overall Cognitive Status: Appears within functional limits for tasks assessed/performed Arousal/Alertness: Awake/alert Orientation Level: Appears intact for tasks assessed Behavior During Session: [facility] for tasks performed    Mobility  Shoulder Instructions Bed Mobility Supine to Sit: 4: Min assist Details for Bed Mobility Assistance: assist with LLE Transfers Sit to Stand: 4: Min guard;From chair/3-in-1;From elevated surface;From bed Details for Transfer Assistance: cues for LLE for comfort only       Exercises      Balance     End of Session OT - End of Session Activity Tolerance: Patient tolerated treatment well Patient left: in chair;with call bell/phone within reach;with family/visitor present  GO     Lee Torres 05/09/2012, 9:09 AM Marica Otter, OTR/L [phone] 05/09/2012

## 2012-05-09 NOTE — Progress Notes (Signed)
Physical Therapy Treatment Patient Details Name: Sajad Glander MRN: [id number] DOB: 12-06-1945 Today's Date: 05/09/2012 Time: 7829-5621 PT Time Calculation (min): 23 min  PT Assessment / Plan / Recommendation Comments on Treatment Session  Progressing with mobiilty. Pt preferred to ambulate without shoe on today. Recommend HHPt    Follow Up Recommendations  Home health PT     Does the patient have the potential to tolerate intense rehabilitation     Barriers to Discharge        Equipment Recommendations  None recommended by PT    Recommendations for Other Services OT consult  Frequency 7X/week   Plan Discharge plan remains appropriate    Precautions / Restrictions Precautions Precautions: Fall Precaution Comments: Direct anterior approach BUT MD wrote to avoid excessive external rotation Restrictions Weight Bearing Restrictions: No LLE Weight Bearing: Weight bearing as tolerated   Pertinent Vitals/Pain 7/10 L hip    Mobility  Bed Mobility Transfers Transfers: Sit to Stand;Stand to Sit Sit to Stand: 4: Min guard;From chair/3-in-1 Stand to Sit: 4: Min guard;To chair/3-in-1 Details for Transfer Assistance: VCS L LE placement.  Ambulation/Gait Ambulation Distance (Feet): 100 Feet Assistive device: Rolling walker Ambulation/Gait Assistance Details: Slow gait speed. Pt tolerating ambulation much better on today. Beginning to use reciprocal gait pattern.  Gait Pattern: Step-through pattern;Decreased stride length    Exercises     PT Diagnosis:    PT Problem List:   PT Treatment Interventions:     PT Goals Acute Rehab PT Goals Pt will go Sit to Stand: with supervision PT Goal: Sit to Stand - Progress: Progressing toward goal Pt will Ambulate: 51 - 150 feet;with supervision;with least restrictive assistive device PT Goal: Ambulate - Progress: Progressing toward goal  Visit Information  Last PT Received On: 05/09/12 Assistance Needed: +1    Subjective Data  Subjective: "I'm not hurting as bad as I was yesterday" Patient Stated Goal: home. less pain   Cognition  Overall Cognitive Status: Appears within functional limits for tasks assessed/performed Arousal/Alertness: Awake/alert Orientation Level: Appears intact for tasks assessed Behavior During Session: [facility] for tasks performed    Balance     End of Session PT - End of Session Equipment Utilized During Treatment: Gait belt Activity Tolerance: Patient tolerated treatment well Patient left: in chair;with call bell/phone within reach   GP     Rebeca Alert [facility] 05/09/2012, 10:09 AM [phone]

## 2012-05-09 NOTE — Progress Notes (Signed)
Physical Therapy Treatment Patient Details Name: Lee Torres MRN: [id number] DOB: [birth year] Today's Date: 05/09/2012 Time: 1330-1405 PT Time Calculation (min): 35 min  PT Assessment / Plan / Recommendation Comments on Treatment Session  continuing to progress well. having intermittent bouts of nausea during session today. Needs to practice steps. Recommend HHPT    Follow Up Recommendations  Home health PT     Does the patient have the potential to tolerate intense rehabilitation     Barriers to Discharge        Equipment Recommendations  None recommended by PT    Recommendations for Other Services    Frequency 7X/week   Plan Discharge plan remains appropriate    Precautions / Restrictions Restrictions Weight Bearing Restrictions: No LLE Weight Bearing: Weight bearing as tolerated   Pertinent Vitals/Pain "not much" L hip with activity    Mobility  Transfers Transfers: Sit to Stand;Stand to Sit Sit to Stand: 4: Min guard;From chair/3-in-1;With armrests Stand to Sit: 4: Min guard;To chair/3-in-1;With armrests Ambulation/Gait Ambulation/Gait Assistance: 4: Min guard Ambulation Distance (Feet): 200 Feet Assistive device: Rolling walker Ambulation/Gait Assistance Details: Slow gait speed. Pt tolerating ambulation much better  Gait Pattern: Trunk flexed;Step-through pattern;Decreased stride length    Exercises Total Joint Exercises Ankle Circles/Pumps: AROM;Both;10 reps;Seated Quad Sets: AROM;Both;10 reps;Seated Heel Slides: AAROM;Left;10 reps;Seated Hip ABduction/ADduction: AAROM;Left;10 reps;Seated   PT Diagnosis:    PT Problem List:   PT Treatment Interventions:     PT Goals Acute Rehab PT Goals Pt will go Sit to Stand: with supervision PT Goal: Sit to Stand - Progress: Progressing toward goal Pt will Ambulate: 51 - 150 feet;with supervision;with least restrictive assistive device PT Goal: Ambulate - Progress: Progressing toward goal  Visit Information  Last  PT Received On: 05/09/12 Assistance Needed: +1    Subjective Data  Subjective: "Not much pain" Patient Stated Goal: home.    Cognition  Overall Cognitive Status: Appears within functional limits for tasks assessed/performed Arousal/Alertness: Awake/alert Orientation Level: Appears intact for tasks assessed Behavior During Session: [facility] for tasks performed    Balance     End of Session PT - End of Session Activity Tolerance: Patient tolerated treatment well Patient left: in chair;with call bell/phone within reach;with family/visitor present   GP     Rebeca Alert [facility] 05/09/2012, 2:39 PM [phone]

## 2012-05-09 NOTE — Progress Notes (Signed)
Pt does have sediment and blood in urine - this workup can be started as an outpt - no prior problems with urinary fxn

## 2012-05-10 ENCOUNTER — Encounter (HOSPITAL_COMMUNITY): Payer: Self-pay | Admitting: Orthopaedic Surgery

## 2012-05-10 LAB — CBC
MCV: 89 fL (ref 78.0–100.0)
Platelets: 182 10*3/uL (ref 150–400)
RBC: 2.73 MIL/uL — ABNORMAL LOW (ref 4.22–5.81)
RDW: 13.6 % (ref 11.5–15.5)
WBC: 7 10*3/uL (ref 4.0–10.5)

## 2012-05-10 MED ORDER — METHOCARBAMOL 500 MG PO TABS: 500.0000 mg | ORAL_TABLET | Freq: Four times a day (QID) | ORAL | Status: DC | PRN

## 2012-05-10 MED ORDER — OXYCODONE-ACETAMINOPHEN 5-325 MG PO TABS: 1.0000 | ORAL_TABLET | ORAL | Status: AC | PRN

## 2012-05-10 MED ORDER — ASPIRIN 325 MG PO TBEC: 325.0000 mg | DELAYED_RELEASE_TABLET | Freq: Two times a day (BID) | ORAL | Status: DC

## 2012-05-10 MED ORDER — DSS 100 MG PO CAPS: 100.0000 mg | ORAL_CAPSULE | Freq: Two times a day (BID) | ORAL | Status: DC

## 2012-05-10 MED ORDER — FERROUS SULFATE 325 (65 FE) MG PO TABS: 325.0000 mg | ORAL_TABLET | Freq: Three times a day (TID) | ORAL | Status: DC

## 2012-05-10 NOTE — Progress Notes (Signed)
Physical Therapy Treatment Patient Details Name: Lee Torres MRN: [id number] DOB: [birth year] Today's Date: 05/10/2012 Time: 0902-0928 PT Time Calculation (min): 26 min  PT Assessment / Plan / Recommendation Comments on Treatment Session  POD # 3 THR Direct Anterior THR plans to D/C to home today. Practiced steps.  Instructed on use of ICE.  Instructed on HEP.    Follow Up Recommendations  Home health PT     Does the patient have the potential to tolerate intense rehabilitation     Barriers to Discharge        Equipment Recommendations  None recommended by PT    Recommendations for Other Services    Frequency 7X/week   Plan Discharge plan remains appropriate    Precautions / Restrictions Precautions Precautions: None Precaution Comments: Direct anterior approach  Restrictions Weight Bearing Restrictions: No LLE Weight Bearing: Weight bearing as tolerated   Pertinent Vitals/Pain C/o "soreness" ICE applied    Mobility  Bed Mobility Bed Mobility: Not assessed Details for Bed Mobility Assistance: Pt OOB in recliner Transfers Transfers: Sit to Stand;Stand to Sit Sit to Stand: 6: Modified independent (Device/Increase time) Stand to Sit: 6: Modified independent (Device/Increase time) Details for Transfer Assistance: increased time Ambulation/Gait Ambulation/Gait Assistance: 6: Modified independent (Device/Increase time) Ambulation Distance (Feet): 225 Feet Assistive device: Rolling walker Ambulation/Gait Assistance Details: <25% VC's on safety with turns and backward gait Gait Pattern: Step-through pattern;Decreased stride length Gait velocity: decreased Stairs: Yes Stairs Assistance: 5: Supervision Stairs Assistance Details (indicate cue type and reason): <25% VC's on safety and sequencing Stair Management Technique: One rail Left;With crutches;Forwards Number of Stairs: 4      PT Goals                                      progressing    Visit Information  Last  PT Received On: 05/10/12 Assistance Needed: +1    Subjective Data      Cognition       Balance     End of Session PT - End of Session Equipment Utilized During Treatment: Gait belt Activity Tolerance: Patient tolerated treatment well Patient left: in chair;with call bell/phone within reach   Felecia Shelling  PTA [facility]  Acute  Rehab Pager     [phone]

## 2012-05-10 NOTE — Care Management Note (Signed)
    Page 1 of 1   05/10/2012     2:54:25 PM   CARE MANAGEMENT NOTE 05/10/2012  Patient:  Lee Torres,Lee Torres   Account Number:  [id number]  Date Initiated:  05/10/2012  Documentation initiated by:  Colleen Can  Subjective/Objective Assessment:   dx severe osteoaqrthritis left hip; total hip replacemnt-anterior approach on day of admission.    Per-arranged for [facility] to provide [facility] services upon discharge     Action/Plan:   CM spoke with patient. Plans are for patient to return to his  home in [place], [place] where spouse will be caregiver. He already has RW and commode seat at home. wants HH agency that is in network   Anticipated DC Date:  05/10/2012   Anticipated DC Plan:  HOME W HOME HEALTH SERVICES      DC Planning Services  CM consult      [facility] Choice  HOME HEALTH   Choice offered to / List presented to:  [name]        HH arranged  HH-2 PT      [facility] agency  [facility]   Status of service:  Completed, signed off Medicare Important Message given?  NA - LOS <3 / Initial given by admissions (If response is "NO", the following Medicare IM given date fields will be blank) Date Medicare IM given:   Date Additional Medicare IM given:    Discharge Disposition:  HOME W HOME HEALTH SERVICES  Per UR Regulation:    If discussed at Long Length of Stay Meetings, dates discussed:    Comments:  05/10/2012 Colleen Can BSN RN CCM [phone] Genevieve Norlander will provide [facility] services with start date 05/11/2012.

## 2012-05-10 NOTE — Progress Notes (Signed)
Utilization review completed.  

## 2012-05-10 NOTE — Discharge Summary (Signed)
Patient ID: Lee Torres MRN: [id number] DOB/AGE: June 27, 1945 67 y.o.  Admit date: 05/07/2012 Discharge date: 05/10/2012  Admission Diagnoses:  Principal Problem:  *Degenerative arthritis of hip   Discharge Diagnoses:  Same  Past Medical History  Diagnosis Date  . Hypertension   . Elevated cholesterol   . Arthritis     oa  . Inguinal hernia     left side, repaired unsuccessfully 2 yrs ago    Surgeries: Procedure(s): TOTAL HIP ARTHROPLASTY ANTERIOR APPROACH on 05/07/2012   Consultants:    Discharged Condition: Improved  Hospital Course: Deitrick Capra is an 67 y.o. male who was admitted 05/07/2012 for operative treatment ofDegenerative arthritis of hip. Patient has severe unremitting pain that affects sleep, daily activities, and work/hobbies. After pre-op clearance the patient was taken to the operating room on 05/07/2012 and underwent  Procedure(s): TOTAL HIP ARTHROPLASTY ANTERIOR APPROACH.    Patient was given perioperative antibiotics: Anti-infectives     Start     Dose/Rate Route Frequency Ordered Stop   05/07/12 1800   ceFAZolin (ANCEF) IVPB 2 g/50 mL premix        2 g 100 mL/hr over 30 Minutes Intravenous Every 6 hours 05/07/12 1609 05/08/12 0120   05/07/12 0945   ceFAZolin (ANCEF) IVPB 2 g/50 mL premix        2 g 100 mL/hr over 30 Minutes Intravenous On call to O.R. 05/07/12 0935 05/07/12 1155           Patient was given sequential compression devices, early ambulation, and chemoprophylaxis to prevent DVT.  Patient benefited maximally from hospital stay and there were no complications.    Recent vital signs: Patient Vitals for the past 24 hrs:  BP Temp Temp src Pulse Resp SpO2  05/10/12 0605 107/69 mmHg 99 F (37.2 C) Oral 82  16  95 %  05/10/12 0415 - - - - 16  -  18-May-2012 2300 - - - - 16  -  2012-05-18 2202 106/66 mmHg 99.5 F (37.5 C) Oral 83  17  -  May 18, 2012 2010 - - - - 16  -  2012-05-18 1538 - - - - 18  -  May 18, 2012 1355 101/61 mmHg 98.5 F (36.9 C)  Oral 79  17  -  May 18, 2012 1135 - - - - 17  -     Recent laboratory studies:  Basename 05/10/12 0437 May 18, 2012 0458 05/08/12 0415 05/07/12 1000  WBC 7.0 9.7 -- --  HGB 8.5* 9.0* -- --  HCT 24.3* 26.2* -- --  PLT 182 192 -- --  NA -- -- 133* 137  K -- -- 4.1 3.6  CL -- -- 99 101  CO2 -- -- 30 26  BUN -- -- 13 16  CREATININE -- -- 0.84 0.78  GLUCOSE -- -- 113* 89  INR -- -- -- 1.03  CALCIUM -- -- 8.1* --     Discharge Medications:     Medication List     As of 05/10/2012  7:23 AM    STOP taking these medications         GOODY HEADACHE PO      TAKE these medications         aspirin 325 MG EC tablet   Take 1 tablet (325 mg total) by mouth 2 (two) times daily.      beta carotene w/minerals tablet   Take 1 tablet by mouth daily.      DSS 100 MG Caps   Take 100 mg by mouth 2 (  two) times daily.      ferrous sulfate 325 (65 FE) MG tablet   Take 1 tablet (325 mg total) by mouth 3 (three) times daily after meals.      GERITOL COMPLETE PO   Take 1 tablet by mouth every 7 (seven) days.      lisinopril-hydrochlorothiazide 20-25 MG per tablet   Commonly known as: PRINZIDE,ZESTORETIC   Take 1 tablet by mouth daily before breakfast.      methocarbamol 500 MG tablet   Commonly known as: ROBAXIN   Take 1 tablet (500 mg total) by mouth every 6 (six) hours as needed.      metoprolol succinate 50 MG 24 hr tablet   Commonly known as: TOPROL-XL   Take 50 mg by mouth daily before breakfast. Take with or immediately following a meal.      naproxen sodium 220 MG tablet   Commonly known as: ANAPROX   Take 220 mg by mouth daily before breakfast.      oxyCODONE-acetaminophen 5-325 MG per tablet   Commonly known as: PERCOCET/ROXICET   Take 1-2 tablets by mouth every 4 (four) hours as needed for pain.        Diagnostic Studies: Dg Chest 2 View  05/07/2012  *RADIOLOGY REPORT*  Clinical Data: Preop for left hip surgery.  CHEST - 2 VIEW  Comparison: None.  Findings: Two views of  the chest demonstrate clear lungs. Heart and mediastinum are within normal limits.  The trachea is midline.  No evidence for airspace disease or edema.  IMPRESSION: No acute cardiopulmonary disease.   Original Report Authenticated By: Richarda Overlie, M.D.    Dg Hip Complete Left  05/07/2012  *RADIOLOGY REPORT*  Clinical Data: Total hip replacement.  LEFT HIP - COMPLETE 2+ VIEW  Comparison: None.  Findings: Two intraoperative views demonstrating a left hip arthroplasty.  No acute hardware complication identified.  IMPRESSION: Intraoperative imaging of left hip arthroplasty.   Original Report Authenticated By: Jeronimo Greaves, M.D.    Dg Pelvis Portable  05/07/2012  *RADIOLOGY REPORT*  Clinical Data: Post left hip replacement  PORTABLE PELVIS  Comparison: Portable exam 1454 hours without priors for comparison  Findings: Acetabular and femoral components of a right hip prosthesis are identified in expected positions. Bones appear demineralized. No fracture, dislocation or bone destruction identified.  IMPRESSION: Left hip prosthesis without acute abnormalities.   Original Report Authenticated By: Ulyses Southward, M.D.    Dg Hip Portable 1 View Left  05/07/2012  *RADIOLOGY REPORT*  Clinical Data: Post left total hip replacement  PORTABLE LEFT HIP - 1 VIEW  Comparison: Portable cross-table lateral view 1459 hours correlated with a portable radiograph of the pelvis  Findings: Acetabular and femoral components of a left hip prosthesis identified. No fracture or dislocation seen.  IMPRESSION: Left hip prosthesis without acute abnormality.   Original Report Authenticated By: Ulyses Southward, M.D.    Dg C-arm 61-120 Min-no Report  05/07/2012  CLINICAL DATA: left total hip   C-ARM 61-120 MINUTES  Fluoroscopy was utilized by the requesting physician.  No radiographic  interpretation.      Disposition: to home      Discharge Orders    Future Orders Please Complete By Expires   Diet - low sodium heart healthy      Call MD /  Call 911      Comments:   If you experience chest pain or shortness of breath, CALL 911 and be transported to the hospital emergency room.  If you  develope a fever above 101 F, pus (white drainage) or increased drainage or redness at the wound, or calf pain, call your surgeon's office.   Constipation Prevention      Comments:   Drink plenty of fluids.  Prune juice may be helpful.  You may use a stool softener, such as Colace (over the counter) 100 mg twice a day.  Use MiraLax (over the counter) for constipation as needed.   Increase activity slowly as tolerated      Discharge instructions      Comments:   Increase your activities as comfort allows. Leave your current dressing in place until this Wed. 05/12/12; then may remove and get your incision wet in the shower. New dry dressing daily starting 05/12/12.   Discharge patient         Follow-up Information    Follow up with Kathryne Hitch, MD.   Contact information:   [address] [place] [address] [phone]           Signed: Kathryne Hitch 05/10/2012, 7:23 AM

## 2012-05-11 DIAGNOSIS — IMO0001 Reserved for inherently not codable concepts without codable children: Secondary | ICD-10-CM | POA: Diagnosis not present

## 2012-05-11 DIAGNOSIS — Z96649 Presence of unspecified artificial hip joint: Secondary | ICD-10-CM | POA: Diagnosis not present

## 2012-05-11 DIAGNOSIS — Z471 Aftercare following joint replacement surgery: Secondary | ICD-10-CM | POA: Diagnosis not present

## 2012-05-11 DIAGNOSIS — I10 Essential (primary) hypertension: Secondary | ICD-10-CM | POA: Diagnosis not present

## 2012-05-11 DIAGNOSIS — M169 Osteoarthritis of hip, unspecified: Secondary | ICD-10-CM | POA: Diagnosis not present

## 2012-05-11 NOTE — Progress Notes (Signed)
Patient ID: Lee Torres, male   DOB: [birth year], 67 y.o.   MRN: [id number] Mr. Abruzzo had a transfusion secondary to acute blood loss anemia from surgery on the day of admision.

## 2012-05-14 DIAGNOSIS — IMO0001 Reserved for inherently not codable concepts without codable children: Secondary | ICD-10-CM | POA: Diagnosis not present

## 2012-05-14 DIAGNOSIS — Z96649 Presence of unspecified artificial hip joint: Secondary | ICD-10-CM | POA: Diagnosis not present

## 2012-05-14 DIAGNOSIS — I10 Essential (primary) hypertension: Secondary | ICD-10-CM | POA: Diagnosis not present

## 2012-05-14 DIAGNOSIS — Z471 Aftercare following joint replacement surgery: Secondary | ICD-10-CM | POA: Diagnosis not present

## 2012-05-17 DIAGNOSIS — I10 Essential (primary) hypertension: Secondary | ICD-10-CM | POA: Diagnosis not present

## 2012-05-17 DIAGNOSIS — IMO0001 Reserved for inherently not codable concepts without codable children: Secondary | ICD-10-CM | POA: Diagnosis not present

## 2012-05-17 DIAGNOSIS — Z96649 Presence of unspecified artificial hip joint: Secondary | ICD-10-CM | POA: Diagnosis not present

## 2012-05-17 DIAGNOSIS — Z471 Aftercare following joint replacement surgery: Secondary | ICD-10-CM | POA: Diagnosis not present

## 2012-05-21 DIAGNOSIS — IMO0001 Reserved for inherently not codable concepts without codable children: Secondary | ICD-10-CM | POA: Diagnosis not present

## 2012-05-21 DIAGNOSIS — Z96649 Presence of unspecified artificial hip joint: Secondary | ICD-10-CM | POA: Diagnosis not present

## 2012-05-21 DIAGNOSIS — I10 Essential (primary) hypertension: Secondary | ICD-10-CM | POA: Diagnosis not present

## 2012-05-21 DIAGNOSIS — Z471 Aftercare following joint replacement surgery: Secondary | ICD-10-CM | POA: Diagnosis not present

## 2012-05-24 DIAGNOSIS — M169 Osteoarthritis of hip, unspecified: Secondary | ICD-10-CM | POA: Diagnosis not present

## 2012-05-25 DIAGNOSIS — I10 Essential (primary) hypertension: Secondary | ICD-10-CM | POA: Diagnosis not present

## 2012-05-25 DIAGNOSIS — Z471 Aftercare following joint replacement surgery: Secondary | ICD-10-CM | POA: Diagnosis not present

## 2012-05-25 DIAGNOSIS — Z96649 Presence of unspecified artificial hip joint: Secondary | ICD-10-CM | POA: Diagnosis not present

## 2012-05-25 DIAGNOSIS — IMO0001 Reserved for inherently not codable concepts without codable children: Secondary | ICD-10-CM | POA: Diagnosis not present

## 2012-05-26 DIAGNOSIS — IMO0001 Reserved for inherently not codable concepts without codable children: Secondary | ICD-10-CM | POA: Diagnosis not present

## 2012-05-26 DIAGNOSIS — I10 Essential (primary) hypertension: Secondary | ICD-10-CM | POA: Diagnosis not present

## 2012-05-26 DIAGNOSIS — Z96649 Presence of unspecified artificial hip joint: Secondary | ICD-10-CM | POA: Diagnosis not present

## 2012-05-26 DIAGNOSIS — Z471 Aftercare following joint replacement surgery: Secondary | ICD-10-CM | POA: Diagnosis not present

## 2012-05-28 DIAGNOSIS — IMO0001 Reserved for inherently not codable concepts without codable children: Secondary | ICD-10-CM | POA: Diagnosis not present

## 2012-05-28 DIAGNOSIS — Z471 Aftercare following joint replacement surgery: Secondary | ICD-10-CM | POA: Diagnosis not present

## 2012-05-28 DIAGNOSIS — Z96649 Presence of unspecified artificial hip joint: Secondary | ICD-10-CM | POA: Diagnosis not present

## 2012-05-28 DIAGNOSIS — I10 Essential (primary) hypertension: Secondary | ICD-10-CM | POA: Diagnosis not present

## 2012-05-31 DIAGNOSIS — Z96649 Presence of unspecified artificial hip joint: Secondary | ICD-10-CM | POA: Diagnosis not present

## 2012-05-31 DIAGNOSIS — IMO0001 Reserved for inherently not codable concepts without codable children: Secondary | ICD-10-CM | POA: Diagnosis not present

## 2012-05-31 DIAGNOSIS — Z471 Aftercare following joint replacement surgery: Secondary | ICD-10-CM | POA: Diagnosis not present

## 2012-05-31 DIAGNOSIS — I10 Essential (primary) hypertension: Secondary | ICD-10-CM | POA: Diagnosis not present

## 2012-06-01 ENCOUNTER — Inpatient Hospital Stay: Admit: 2012-06-01 | Payer: Self-pay | Admitting: Orthopaedic Surgery

## 2012-06-01 DIAGNOSIS — Z96649 Presence of unspecified artificial hip joint: Secondary | ICD-10-CM | POA: Diagnosis not present

## 2012-06-01 DIAGNOSIS — Z471 Aftercare following joint replacement surgery: Secondary | ICD-10-CM | POA: Diagnosis not present

## 2012-06-01 DIAGNOSIS — I10 Essential (primary) hypertension: Secondary | ICD-10-CM | POA: Diagnosis not present

## 2012-06-01 DIAGNOSIS — IMO0001 Reserved for inherently not codable concepts without codable children: Secondary | ICD-10-CM | POA: Diagnosis not present

## 2012-06-01 SURGERY — ARTHROPLASTY, HIP, TOTAL, ANTERIOR APPROACH
Anesthesia: Choice | Laterality: Left

## 2012-06-02 DIAGNOSIS — Z471 Aftercare following joint replacement surgery: Secondary | ICD-10-CM | POA: Diagnosis not present

## 2012-06-02 DIAGNOSIS — IMO0001 Reserved for inherently not codable concepts without codable children: Secondary | ICD-10-CM | POA: Diagnosis not present

## 2012-06-02 DIAGNOSIS — Z96649 Presence of unspecified artificial hip joint: Secondary | ICD-10-CM | POA: Diagnosis not present

## 2012-06-02 DIAGNOSIS — I10 Essential (primary) hypertension: Secondary | ICD-10-CM | POA: Diagnosis not present

## 2012-10-18 DIAGNOSIS — M169 Osteoarthritis of hip, unspecified: Secondary | ICD-10-CM | POA: Diagnosis not present

## 2012-12-31 DIAGNOSIS — Z79899 Other long term (current) drug therapy: Secondary | ICD-10-CM | POA: Diagnosis not present

## 2012-12-31 DIAGNOSIS — Z23 Encounter for immunization: Secondary | ICD-10-CM | POA: Diagnosis not present

## 2012-12-31 DIAGNOSIS — I10 Essential (primary) hypertension: Secondary | ICD-10-CM | POA: Diagnosis not present

## 2013-01-18 DIAGNOSIS — I10 Essential (primary) hypertension: Secondary | ICD-10-CM | POA: Diagnosis not present

## 2013-01-18 DIAGNOSIS — H1010 Acute atopic conjunctivitis, unspecified eye: Secondary | ICD-10-CM | POA: Diagnosis not present

## 2013-01-18 DIAGNOSIS — Z006 Encounter for examination for normal comparison and control in clinical research program: Secondary | ICD-10-CM | POA: Diagnosis not present

## 2013-05-16 DIAGNOSIS — M25519 Pain in unspecified shoulder: Secondary | ICD-10-CM | POA: Diagnosis not present

## 2013-05-16 DIAGNOSIS — M719 Bursopathy, unspecified: Secondary | ICD-10-CM | POA: Diagnosis not present

## 2013-05-16 DIAGNOSIS — M67919 Unspecified disorder of synovium and tendon, unspecified shoulder: Secondary | ICD-10-CM | POA: Diagnosis not present

## 2013-05-16 DIAGNOSIS — M25559 Pain in unspecified hip: Secondary | ICD-10-CM | POA: Diagnosis not present

## 2013-05-16 DIAGNOSIS — M1610 Unilateral primary osteoarthritis, unspecified hip: Secondary | ICD-10-CM | POA: Diagnosis not present

## 2013-05-16 DIAGNOSIS — M169 Osteoarthritis of hip, unspecified: Secondary | ICD-10-CM | POA: Diagnosis not present

## 2013-09-07 IMAGING — CR DG CHEST 2V
2 series · 2 of 2 positions shown · non-contrast
Comparison: None.

CLINICAL DATA: Preop for left hip surgery.

CHEST - 2 VIEW

[w chest lat]
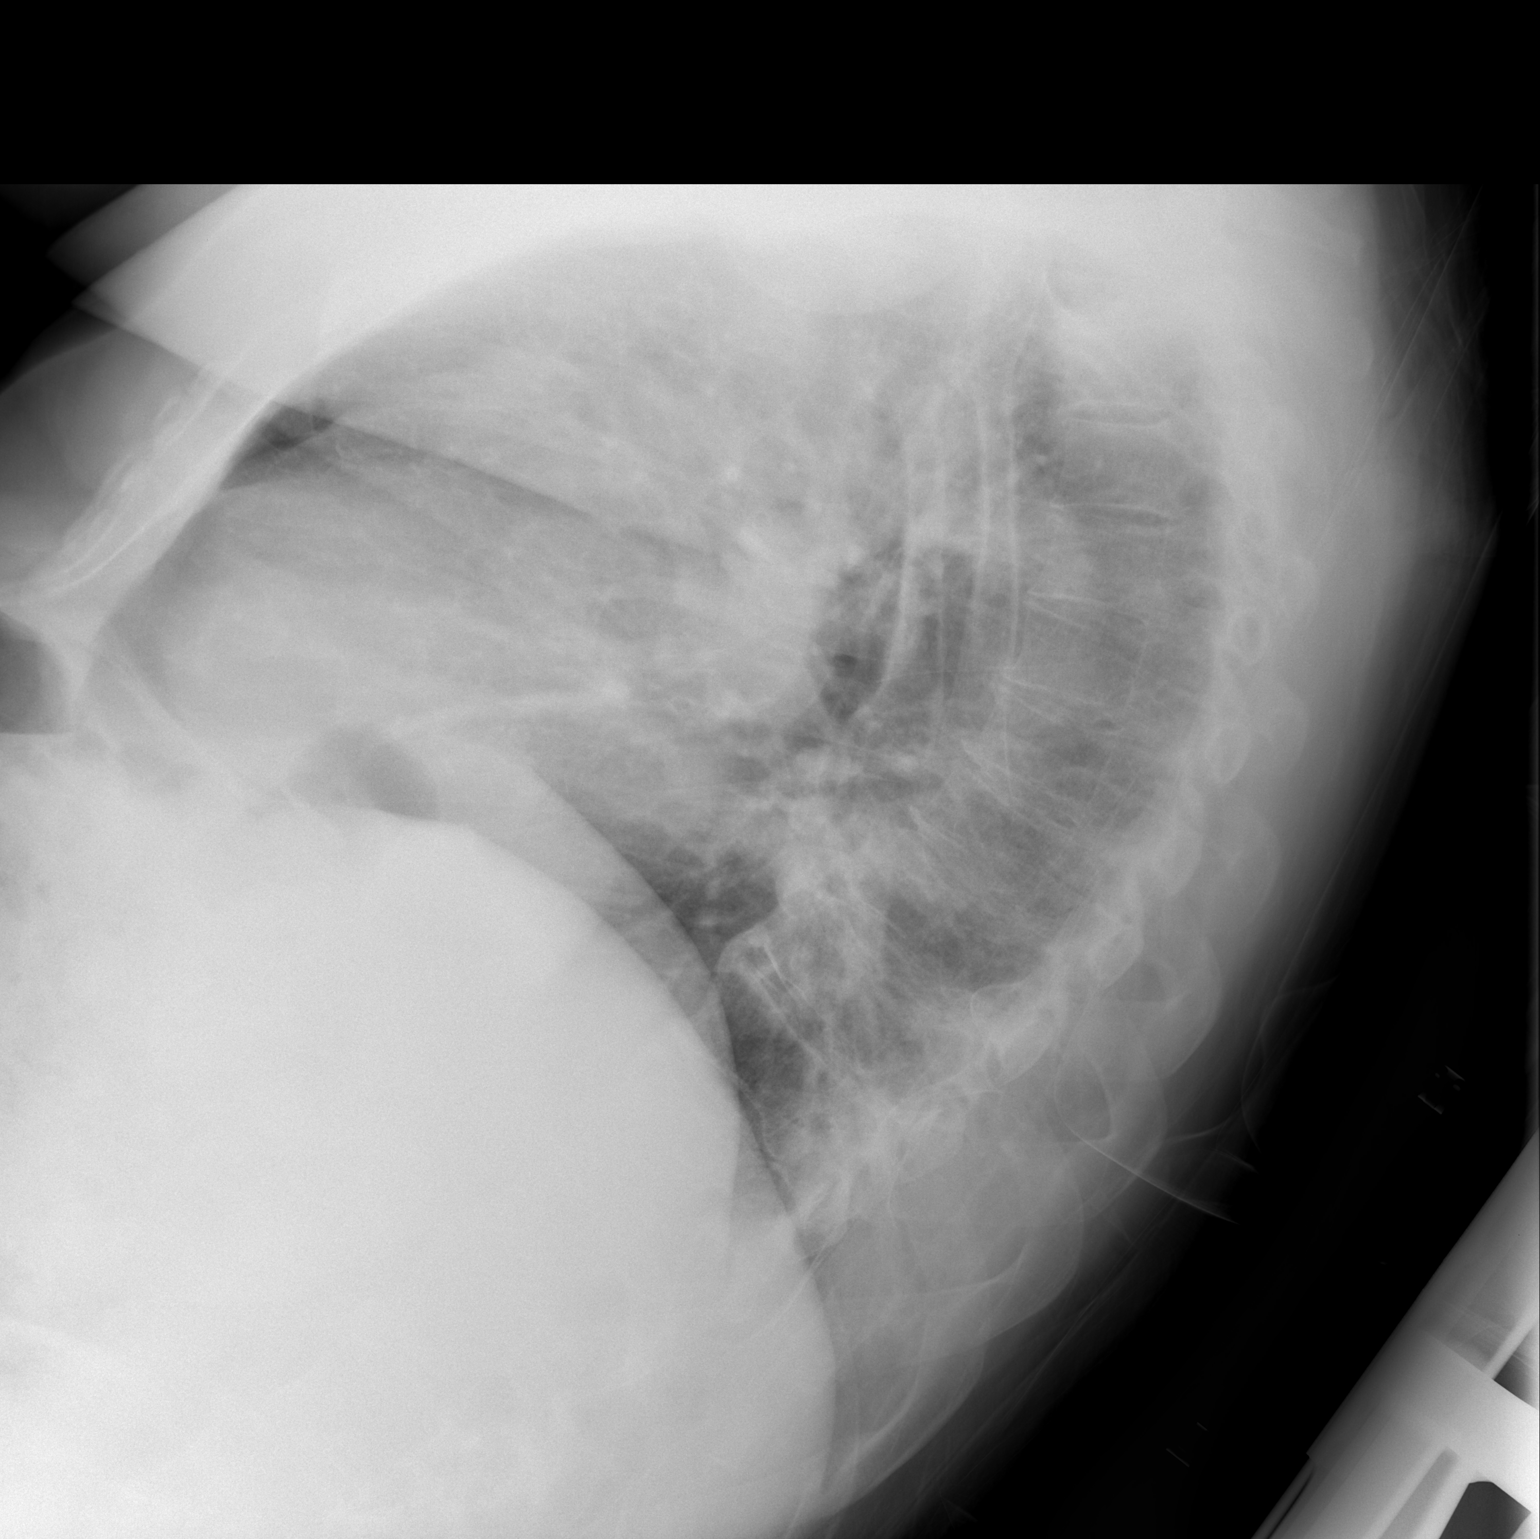

[view not recorded]
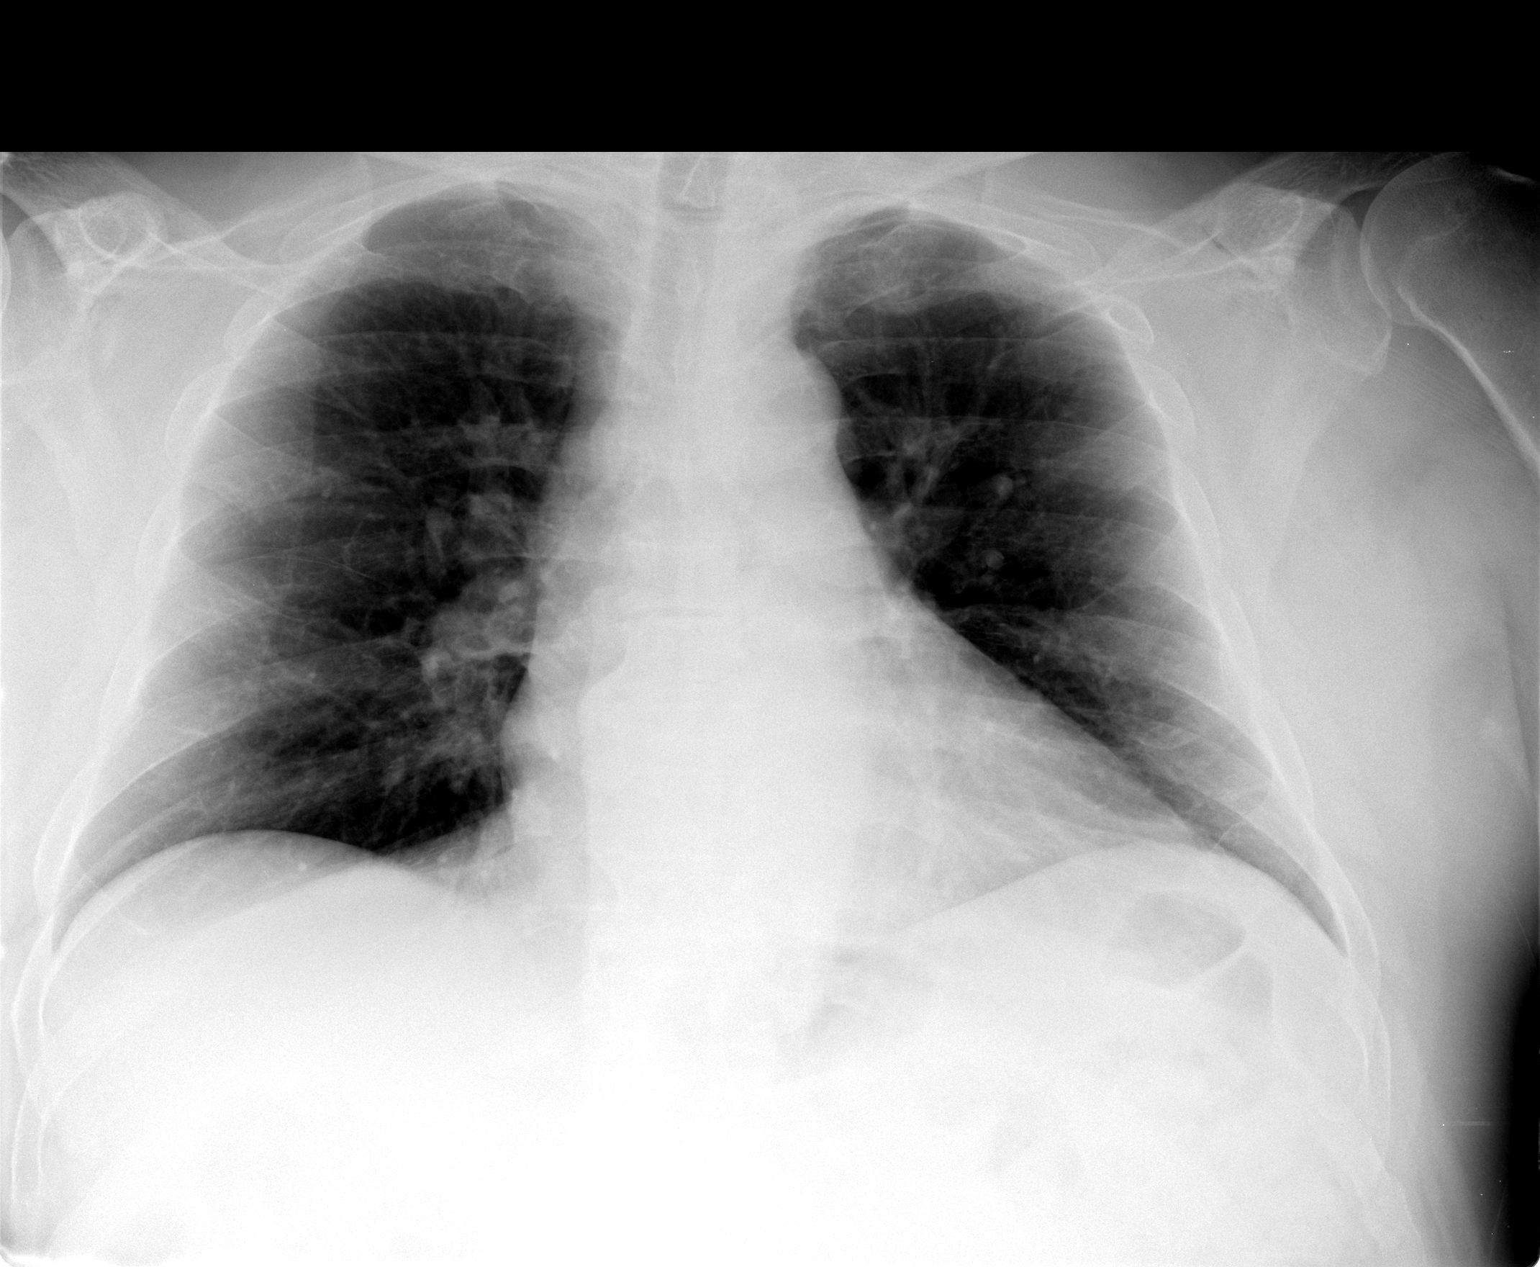

[2 of 2 positions shown; findings below may reference images not displayed]

FINDINGS: Two views of the chest demonstrate clear lungs. Heart and
mediastinum are within normal limits.  The trachea is midline.  No
evidence for airspace disease or edema.
IMPRESSION: No acute cardiopulmonary disease.

## 2014-02-17 DIAGNOSIS — E782 Mixed hyperlipidemia: Secondary | ICD-10-CM | POA: Diagnosis not present

## 2014-02-17 DIAGNOSIS — I10 Essential (primary) hypertension: Secondary | ICD-10-CM | POA: Diagnosis not present

## 2014-02-17 DIAGNOSIS — Z0000 Encounter for general adult medical examination without abnormal findings: Secondary | ICD-10-CM | POA: Diagnosis not present

## 2014-02-17 DIAGNOSIS — Z79899 Other long term (current) drug therapy: Secondary | ICD-10-CM | POA: Diagnosis not present

## 2014-02-17 DIAGNOSIS — Z23 Encounter for immunization: Secondary | ICD-10-CM | POA: Diagnosis not present

## 2014-02-17 DIAGNOSIS — Z1211 Encounter for screening for malignant neoplasm of colon: Secondary | ICD-10-CM | POA: Diagnosis not present

## 2014-02-17 DIAGNOSIS — Z125 Encounter for screening for malignant neoplasm of prostate: Secondary | ICD-10-CM | POA: Diagnosis not present

## 2014-02-17 DIAGNOSIS — H3530 Unspecified macular degeneration: Secondary | ICD-10-CM | POA: Diagnosis not present

## 2014-03-02 DIAGNOSIS — H35713 Central serous chorioretinopathy, bilateral: Secondary | ICD-10-CM | POA: Diagnosis not present

## 2014-03-15 DIAGNOSIS — K219 Gastro-esophageal reflux disease without esophagitis: Secondary | ICD-10-CM | POA: Diagnosis not present

## 2014-03-15 DIAGNOSIS — K648 Other hemorrhoids: Secondary | ICD-10-CM | POA: Diagnosis not present

## 2014-03-15 DIAGNOSIS — Z86010 Personal history of colonic polyps: Secondary | ICD-10-CM | POA: Diagnosis not present

## 2014-03-24 DIAGNOSIS — H35713 Central serous chorioretinopathy, bilateral: Secondary | ICD-10-CM | POA: Diagnosis not present

## 2014-03-27 DIAGNOSIS — I10 Essential (primary) hypertension: Secondary | ICD-10-CM | POA: Diagnosis not present

## 2014-04-11 DIAGNOSIS — I10 Essential (primary) hypertension: Secondary | ICD-10-CM | POA: Diagnosis not present

## 2014-04-11 DIAGNOSIS — F17220 Nicotine dependence, chewing tobacco, uncomplicated: Secondary | ICD-10-CM | POA: Diagnosis not present

## 2014-04-11 DIAGNOSIS — K644 Residual hemorrhoidal skin tags: Secondary | ICD-10-CM | POA: Diagnosis not present

## 2014-04-11 DIAGNOSIS — Z79899 Other long term (current) drug therapy: Secondary | ICD-10-CM | POA: Diagnosis not present

## 2014-04-11 DIAGNOSIS — D124 Benign neoplasm of descending colon: Secondary | ICD-10-CM | POA: Diagnosis not present

## 2014-04-11 DIAGNOSIS — D125 Benign neoplasm of sigmoid colon: Secondary | ICD-10-CM | POA: Diagnosis not present

## 2014-04-11 DIAGNOSIS — Z1211 Encounter for screening for malignant neoplasm of colon: Secondary | ICD-10-CM | POA: Diagnosis not present

## 2014-04-11 DIAGNOSIS — D126 Benign neoplasm of colon, unspecified: Secondary | ICD-10-CM | POA: Diagnosis not present

## 2014-05-15 DIAGNOSIS — Z96642 Presence of left artificial hip joint: Secondary | ICD-10-CM | POA: Diagnosis not present

## 2014-05-25 DIAGNOSIS — H35713 Central serous chorioretinopathy, bilateral: Secondary | ICD-10-CM | POA: Diagnosis not present

## 2014-07-13 DIAGNOSIS — H35713 Central serous chorioretinopathy, bilateral: Secondary | ICD-10-CM | POA: Diagnosis not present

## 2014-09-14 DIAGNOSIS — H35713 Central serous chorioretinopathy, bilateral: Secondary | ICD-10-CM | POA: Diagnosis not present

## 2014-10-19 DIAGNOSIS — H35713 Central serous chorioretinopathy, bilateral: Secondary | ICD-10-CM | POA: Diagnosis not present

## 2015-01-18 DIAGNOSIS — H35713 Central serous chorioretinopathy, bilateral: Secondary | ICD-10-CM | POA: Diagnosis not present

## 2015-04-19 DIAGNOSIS — H35713 Central serous chorioretinopathy, bilateral: Secondary | ICD-10-CM | POA: Diagnosis not present

## 2015-04-20 DIAGNOSIS — Z23 Encounter for immunization: Secondary | ICD-10-CM | POA: Diagnosis not present

## 2015-04-20 DIAGNOSIS — I10 Essential (primary) hypertension: Secondary | ICD-10-CM | POA: Diagnosis not present

## 2015-04-20 DIAGNOSIS — E782 Mixed hyperlipidemia: Secondary | ICD-10-CM | POA: Diagnosis not present

## 2015-04-20 DIAGNOSIS — Z0000 Encounter for general adult medical examination without abnormal findings: Secondary | ICD-10-CM | POA: Diagnosis not present

## 2015-04-24 DIAGNOSIS — E782 Mixed hyperlipidemia: Secondary | ICD-10-CM | POA: Diagnosis not present

## 2015-04-24 DIAGNOSIS — Z125 Encounter for screening for malignant neoplasm of prostate: Secondary | ICD-10-CM | POA: Diagnosis not present

## 2015-04-24 DIAGNOSIS — Z79899 Other long term (current) drug therapy: Secondary | ICD-10-CM | POA: Diagnosis not present

## 2015-05-02 DIAGNOSIS — K644 Residual hemorrhoidal skin tags: Secondary | ICD-10-CM | POA: Diagnosis not present

## 2015-05-11 DIAGNOSIS — D124 Benign neoplasm of descending colon: Secondary | ICD-10-CM | POA: Diagnosis not present

## 2015-05-11 DIAGNOSIS — Z1211 Encounter for screening for malignant neoplasm of colon: Secondary | ICD-10-CM | POA: Diagnosis not present

## 2015-05-11 DIAGNOSIS — F17220 Nicotine dependence, chewing tobacco, uncomplicated: Secondary | ICD-10-CM | POA: Diagnosis not present

## 2015-05-11 DIAGNOSIS — Z79899 Other long term (current) drug therapy: Secondary | ICD-10-CM | POA: Diagnosis not present

## 2015-05-11 DIAGNOSIS — M1990 Unspecified osteoarthritis, unspecified site: Secondary | ICD-10-CM | POA: Diagnosis not present

## 2015-05-11 DIAGNOSIS — I10 Essential (primary) hypertension: Secondary | ICD-10-CM | POA: Diagnosis not present

## 2015-05-11 DIAGNOSIS — K5730 Diverticulosis of large intestine without perforation or abscess without bleeding: Secondary | ICD-10-CM | POA: Diagnosis not present

## 2015-05-11 DIAGNOSIS — Z86010 Personal history of colonic polyps: Secondary | ICD-10-CM | POA: Diagnosis not present

## 2015-05-11 DIAGNOSIS — K635 Polyp of colon: Secondary | ICD-10-CM | POA: Diagnosis not present

## 2015-05-11 DIAGNOSIS — K644 Residual hemorrhoidal skin tags: Secondary | ICD-10-CM | POA: Diagnosis not present

## 2015-05-11 DIAGNOSIS — D126 Benign neoplasm of colon, unspecified: Secondary | ICD-10-CM | POA: Diagnosis not present

## 2015-05-11 DIAGNOSIS — D122 Benign neoplasm of ascending colon: Secondary | ICD-10-CM | POA: Diagnosis not present

## 2015-05-31 DIAGNOSIS — H35713 Central serous chorioretinopathy, bilateral: Secondary | ICD-10-CM | POA: Diagnosis not present

## 2015-08-22 DIAGNOSIS — J0100 Acute maxillary sinusitis, unspecified: Secondary | ICD-10-CM | POA: Diagnosis not present

## 2015-10-18 DIAGNOSIS — H35713 Central serous chorioretinopathy, bilateral: Secondary | ICD-10-CM | POA: Diagnosis not present

## 2015-11-08 DIAGNOSIS — K921 Melena: Secondary | ICD-10-CM | POA: Diagnosis not present

## 2015-11-08 DIAGNOSIS — Z79899 Other long term (current) drug therapy: Secondary | ICD-10-CM | POA: Diagnosis not present

## 2015-11-08 DIAGNOSIS — K529 Noninfective gastroenteritis and colitis, unspecified: Secondary | ICD-10-CM | POA: Diagnosis not present

## 2015-11-13 DIAGNOSIS — K529 Noninfective gastroenteritis and colitis, unspecified: Secondary | ICD-10-CM | POA: Diagnosis not present

## 2015-11-26 DIAGNOSIS — Z79899 Other long term (current) drug therapy: Secondary | ICD-10-CM | POA: Diagnosis not present

## 2015-11-26 DIAGNOSIS — R748 Abnormal levels of other serum enzymes: Secondary | ICD-10-CM | POA: Diagnosis not present

## 2015-11-29 DIAGNOSIS — R748 Abnormal levels of other serum enzymes: Secondary | ICD-10-CM | POA: Diagnosis not present

## 2015-12-25 DIAGNOSIS — H2512 Age-related nuclear cataract, left eye: Secondary | ICD-10-CM | POA: Diagnosis not present

## 2016-01-15 DIAGNOSIS — Z79899 Other long term (current) drug therapy: Secondary | ICD-10-CM | POA: Diagnosis not present

## 2016-01-15 DIAGNOSIS — E785 Hyperlipidemia, unspecified: Secondary | ICD-10-CM | POA: Diagnosis not present

## 2016-01-15 DIAGNOSIS — H2512 Age-related nuclear cataract, left eye: Secondary | ICD-10-CM | POA: Diagnosis not present

## 2016-01-15 DIAGNOSIS — Z720 Tobacco use: Secondary | ICD-10-CM | POA: Diagnosis not present

## 2016-01-15 DIAGNOSIS — I10 Essential (primary) hypertension: Secondary | ICD-10-CM | POA: Diagnosis not present

## 2016-02-12 DIAGNOSIS — F17220 Nicotine dependence, chewing tobacco, uncomplicated: Secondary | ICD-10-CM | POA: Diagnosis not present

## 2016-02-12 DIAGNOSIS — I10 Essential (primary) hypertension: Secondary | ICD-10-CM | POA: Diagnosis not present

## 2016-02-12 DIAGNOSIS — E785 Hyperlipidemia, unspecified: Secondary | ICD-10-CM | POA: Diagnosis not present

## 2016-02-12 DIAGNOSIS — M109 Gout, unspecified: Secondary | ICD-10-CM | POA: Diagnosis not present

## 2016-02-12 DIAGNOSIS — E669 Obesity, unspecified: Secondary | ICD-10-CM | POA: Diagnosis not present

## 2016-02-12 DIAGNOSIS — Z79899 Other long term (current) drug therapy: Secondary | ICD-10-CM | POA: Diagnosis not present

## 2016-02-12 DIAGNOSIS — Z6832 Body mass index (BMI) 32.0-32.9, adult: Secondary | ICD-10-CM | POA: Diagnosis not present

## 2016-02-12 DIAGNOSIS — H2511 Age-related nuclear cataract, right eye: Secondary | ICD-10-CM | POA: Diagnosis not present

## 2016-03-03 DIAGNOSIS — H8111 Benign paroxysmal vertigo, right ear: Secondary | ICD-10-CM | POA: Diagnosis not present

## 2016-03-03 DIAGNOSIS — Z23 Encounter for immunization: Secondary | ICD-10-CM | POA: Diagnosis not present

## 2016-06-09 DIAGNOSIS — H35713 Central serous chorioretinopathy, bilateral: Secondary | ICD-10-CM | POA: Diagnosis not present

## 2016-07-03 DIAGNOSIS — H35713 Central serous chorioretinopathy, bilateral: Secondary | ICD-10-CM | POA: Diagnosis not present

## 2016-07-17 DIAGNOSIS — Z79899 Other long term (current) drug therapy: Secondary | ICD-10-CM | POA: Diagnosis not present

## 2016-07-17 DIAGNOSIS — Z0000 Encounter for general adult medical examination without abnormal findings: Secondary | ICD-10-CM | POA: Diagnosis not present

## 2016-07-17 DIAGNOSIS — Z1389 Encounter for screening for other disorder: Secondary | ICD-10-CM | POA: Diagnosis not present

## 2016-07-17 DIAGNOSIS — R748 Abnormal levels of other serum enzymes: Secondary | ICD-10-CM | POA: Diagnosis not present

## 2016-07-17 DIAGNOSIS — E782 Mixed hyperlipidemia: Secondary | ICD-10-CM | POA: Diagnosis not present

## 2016-07-17 DIAGNOSIS — M25552 Pain in left hip: Secondary | ICD-10-CM | POA: Diagnosis not present

## 2016-08-13 DIAGNOSIS — R935 Abnormal findings on diagnostic imaging of other abdominal regions, including retroperitoneum: Secondary | ICD-10-CM | POA: Diagnosis not present

## 2016-08-13 DIAGNOSIS — R945 Abnormal results of liver function studies: Secondary | ICD-10-CM | POA: Diagnosis not present

## 2016-08-13 DIAGNOSIS — D126 Benign neoplasm of colon, unspecified: Secondary | ICD-10-CM | POA: Diagnosis not present

## 2016-08-13 DIAGNOSIS — R748 Abnormal levels of other serum enzymes: Secondary | ICD-10-CM | POA: Diagnosis not present

## 2016-08-18 DIAGNOSIS — R748 Abnormal levels of other serum enzymes: Secondary | ICD-10-CM | POA: Diagnosis not present

## 2016-08-18 DIAGNOSIS — R945 Abnormal results of liver function studies: Secondary | ICD-10-CM | POA: Diagnosis not present

## 2017-01-06 DIAGNOSIS — H35713 Central serous chorioretinopathy, bilateral: Secondary | ICD-10-CM | POA: Diagnosis not present

## 2017-05-08 DIAGNOSIS — H35711 Central serous chorioretinopathy, right eye: Secondary | ICD-10-CM | POA: Diagnosis not present

## 2017-05-08 DIAGNOSIS — H26493 Other secondary cataract, bilateral: Secondary | ICD-10-CM | POA: Diagnosis not present

## 2017-05-14 DIAGNOSIS — G4453 Primary thunderclap headache: Secondary | ICD-10-CM | POA: Diagnosis not present

## 2017-05-14 DIAGNOSIS — Z8249 Family history of ischemic heart disease and other diseases of the circulatory system: Secondary | ICD-10-CM | POA: Diagnosis not present

## 2017-05-14 DIAGNOSIS — Z6835 Body mass index (BMI) 35.0-35.9, adult: Secondary | ICD-10-CM | POA: Diagnosis not present

## 2017-05-20 DIAGNOSIS — G4453 Primary thunderclap headache: Secondary | ICD-10-CM | POA: Diagnosis not present

## 2017-05-21 DIAGNOSIS — Z8489 Family history of other specified conditions: Secondary | ICD-10-CM | POA: Diagnosis not present

## 2017-05-21 DIAGNOSIS — Z136 Encounter for screening for cardiovascular disorders: Secondary | ICD-10-CM | POA: Diagnosis not present

## 2017-05-21 DIAGNOSIS — I77811 Abdominal aortic ectasia: Secondary | ICD-10-CM | POA: Diagnosis not present

## 2017-05-28 DIAGNOSIS — H35711 Central serous chorioretinopathy, right eye: Secondary | ICD-10-CM | POA: Diagnosis not present

## 2017-06-04 DIAGNOSIS — N400 Enlarged prostate without lower urinary tract symptoms: Secondary | ICD-10-CM | POA: Diagnosis not present

## 2017-06-04 DIAGNOSIS — Z125 Encounter for screening for malignant neoplasm of prostate: Secondary | ICD-10-CM | POA: Diagnosis not present

## 2017-06-04 DIAGNOSIS — Z6835 Body mass index (BMI) 35.0-35.9, adult: Secondary | ICD-10-CM | POA: Diagnosis not present

## 2017-07-22 DIAGNOSIS — E782 Mixed hyperlipidemia: Secondary | ICD-10-CM | POA: Diagnosis not present

## 2017-07-22 DIAGNOSIS — I10 Essential (primary) hypertension: Secondary | ICD-10-CM | POA: Diagnosis not present

## 2017-07-22 DIAGNOSIS — Z0000 Encounter for general adult medical examination without abnormal findings: Secondary | ICD-10-CM | POA: Diagnosis not present

## 2017-07-22 DIAGNOSIS — N400 Enlarged prostate without lower urinary tract symptoms: Secondary | ICD-10-CM | POA: Diagnosis not present

## 2017-07-22 DIAGNOSIS — Z79899 Other long term (current) drug therapy: Secondary | ICD-10-CM | POA: Diagnosis not present

## 2017-08-05 DIAGNOSIS — R748 Abnormal levels of other serum enzymes: Secondary | ICD-10-CM | POA: Diagnosis not present

## 2017-08-13 DIAGNOSIS — H35713 Central serous chorioretinopathy, bilateral: Secondary | ICD-10-CM | POA: Diagnosis not present

## 2017-10-01 DIAGNOSIS — R748 Abnormal levels of other serum enzymes: Secondary | ICD-10-CM | POA: Diagnosis not present

## 2017-11-02 DIAGNOSIS — J0100 Acute maxillary sinusitis, unspecified: Secondary | ICD-10-CM | POA: Diagnosis not present

## 2017-11-20 DIAGNOSIS — J0100 Acute maxillary sinusitis, unspecified: Secondary | ICD-10-CM | POA: Diagnosis not present

## 2017-11-24 DIAGNOSIS — H35713 Central serous chorioretinopathy, bilateral: Secondary | ICD-10-CM | POA: Diagnosis not present

## 2018-02-03 DIAGNOSIS — I10 Essential (primary) hypertension: Secondary | ICD-10-CM | POA: Diagnosis not present

## 2018-02-03 DIAGNOSIS — M4696 Unspecified inflammatory spondylopathy, lumbar region: Secondary | ICD-10-CM | POA: Diagnosis not present

## 2018-02-03 DIAGNOSIS — Z23 Encounter for immunization: Secondary | ICD-10-CM | POA: Diagnosis not present

## 2018-02-03 DIAGNOSIS — N400 Enlarged prostate without lower urinary tract symptoms: Secondary | ICD-10-CM | POA: Diagnosis not present

## 2018-02-23 DIAGNOSIS — R945 Abnormal results of liver function studies: Secondary | ICD-10-CM | POA: Diagnosis not present

## 2018-02-23 DIAGNOSIS — Z7901 Long term (current) use of anticoagulants: Secondary | ICD-10-CM | POA: Diagnosis not present

## 2018-02-23 DIAGNOSIS — K760 Fatty (change of) liver, not elsewhere classified: Secondary | ICD-10-CM | POA: Diagnosis not present

## 2018-02-23 DIAGNOSIS — R748 Abnormal levels of other serum enzymes: Secondary | ICD-10-CM | POA: Diagnosis not present

## 2018-03-02 ENCOUNTER — Ambulatory Visit (INDEPENDENT_AMBULATORY_CARE_PROVIDER_SITE_OTHER): Payer: Medicare Other

## 2018-03-02 ENCOUNTER — Ambulatory Visit (INDEPENDENT_AMBULATORY_CARE_PROVIDER_SITE_OTHER): Payer: Medicare Other | Admitting: Orthopaedic Surgery

## 2018-03-02 ENCOUNTER — Other Ambulatory Visit (INDEPENDENT_AMBULATORY_CARE_PROVIDER_SITE_OTHER): Payer: Self-pay

## 2018-03-02 ENCOUNTER — Encounter (INDEPENDENT_AMBULATORY_CARE_PROVIDER_SITE_OTHER): Payer: Self-pay | Admitting: Orthopaedic Surgery

## 2018-03-02 DIAGNOSIS — M4807 Spinal stenosis, lumbosacral region: Secondary | ICD-10-CM | POA: Insufficient documentation

## 2018-03-02 DIAGNOSIS — M25552 Pain in left hip: Secondary | ICD-10-CM

## 2018-03-02 DIAGNOSIS — G8929 Other chronic pain: Secondary | ICD-10-CM

## 2018-03-02 DIAGNOSIS — M25551 Pain in right hip: Secondary | ICD-10-CM

## 2018-03-02 DIAGNOSIS — M5441 Lumbago with sciatica, right side: Secondary | ICD-10-CM | POA: Diagnosis not present

## 2018-03-02 NOTE — Progress Notes (Signed)
Office Visit Note   Patient: Lee Torres           Date of Birth: 1945/11/08           MRN: [id number] Visit Date: 03/02/2018              Requested by: Heywood Bene, MD [address] [place], [place] [address] PCP: Heywood Bene, MD   Assessment & Plan: Visit Diagnoses:  1. Bilateral hip pain   2. Chronic right-sided low back pain with right-sided sciatica     Plan: I do feel most of his symptoms are related to his lumbar spine.  He has severe disease at multiple levels mainly at L2-L3 and L3-L4.  MRI is certainly warranted at this point to rule out nerve compression.  We will see him back after the MRI.  All question concerns were answered and addressed.  Follow-Up Instructions: Return in about 2 weeks (around 03/16/2018).   Orders:  Orders Placed This Encounter  Procedures  . XR HIPS BILAT W OR W/O PELVIS 2V  . XR Lumbar Spine 2-3 Views   No orders of the defined types were placed in this encounter.     Procedures: No procedures performed   Clinical Data: No additional findings.   Subjective: Chief Complaint  Patient presents with  . Left Hip - Pain  . Right Hip - Pain  . Lower Back - Pain  The patient is very well-known to me.  He is a 72 year old gentleman who we performed a left total hip replacement on 6 years ago.  The hip was severely deformed and he was significantly short prior to surgery and we cannot get his leg went out completely due to severe shortening and contracture of years.  We have seen him for long period time is to the hip is doing well but he has had a little bit of pain but he points to more of the trochanteric area in his low back source of his pain is been having little groin pain on the right side as well.  Most of his pain seems to be low in his lumbar spine and lower back.  He is not a diabetic but he cannot  take steroid injections anymore due to the fact the steroids is had on his retinas and he was told by eye specialist not to ever have a steroid injection again.  He denies any weakness in his legs and he denies any numbness tingling in his feet.  HPI  Review of Systems He currently denies any headache, chest pain, shortness of breath, fever, chills, nausea, vomiting  Objective: Vital Signs: There were no vitals taken for this visit.  Physical Exam He is alert and oriented x3 and in no acute distress Ortho Exam Examination of both hips show that they do move smoothly in the groin area in the joint with internal and external rotation.  Most of his pain seems to be in the lower back in the trochanteric area of both hips. Specialty Comments:  No specialty comments available.  Imaging: Xr Hips Bilat W Or W/o Pelvis 2v  Result  Date: 03/02/2018 An AP pelvis and lateral both hips shows a total hip arthroplasty on the left side with no evidence of loosening or complicating features.  There is only slight narrowing of the hip joint on the right side.  Xr Lumbar Spine 2-3 Views  Result Date: 03/02/2018 2 views of the lumbar spine shows severe degenerative changes at multiple levels most notably bit notably at L2-L3 and L3-L4.    PMFS History: Patient Active Problem List   Diagnosis Date Noted  . Degenerative arthritis of hip 05/07/2012   Past Medical History:  Diagnosis Date  . Arthritis    oa  . Elevated cholesterol   . Hypertension   . Inguinal hernia    left side, repaired unsuccessfully 2 yrs ago    History reviewed. No pertinent family history.  Past Surgical History:  Procedure Laterality Date  . HERNIA REPAIR  2011  . TOTAL HIP ARTHROPLASTY  05/07/2012   Procedure: TOTAL HIP ARTHROPLASTY ANTERIOR APPROACH;  Surgeon: Kathryne Hitch, MD;  Location: [facility];  Service: Orthopedics;  Laterality: Left;   Social History   Occupational History  . Not on file  Tobacco  Use  . Smoking status: Never Smoker  . Smokeless tobacco: Current User  Substance and Sexual Activity  . Alcohol use: No  . Drug use: No  . Sexual activity: Not on file

## 2018-03-16 ENCOUNTER — Other Ambulatory Visit (INDEPENDENT_AMBULATORY_CARE_PROVIDER_SITE_OTHER): Payer: Self-pay | Admitting: Orthopaedic Surgery

## 2018-03-16 ENCOUNTER — Other Ambulatory Visit: Payer: Self-pay | Admitting: Orthopaedic Surgery

## 2018-03-16 ENCOUNTER — Ambulatory Visit (INDEPENDENT_AMBULATORY_CARE_PROVIDER_SITE_OTHER): Payer: Medicare Other | Admitting: Orthopaedic Surgery

## 2018-03-16 ENCOUNTER — Encounter (INDEPENDENT_AMBULATORY_CARE_PROVIDER_SITE_OTHER): Payer: Self-pay

## 2018-03-16 DIAGNOSIS — T1590XA Foreign body on external eye, part unspecified, unspecified eye, initial encounter: Secondary | ICD-10-CM

## 2018-03-17 ENCOUNTER — Ambulatory Visit
Admission: RE | Admit: 2018-03-17 | Discharge: 2018-03-17 | Disposition: A | Discharge status: Home / Self Care | Payer: Medicare Other | Source: Ambulatory Visit | Attending: Orthopaedic Surgery | Admitting: Orthopaedic Surgery

## 2018-03-17 DIAGNOSIS — M4807 Spinal stenosis, lumbosacral region: Secondary | ICD-10-CM

## 2018-03-17 DIAGNOSIS — M5136 Other intervertebral disc degeneration, lumbar region: Secondary | ICD-10-CM | POA: Diagnosis not present

## 2018-03-17 DIAGNOSIS — T1590XA Foreign body on external eye, part unspecified, unspecified eye, initial encounter: Secondary | ICD-10-CM

## 2018-03-25 ENCOUNTER — Ambulatory Visit (INDEPENDENT_AMBULATORY_CARE_PROVIDER_SITE_OTHER): Payer: Medicare Other | Admitting: Orthopaedic Surgery

## 2018-03-25 ENCOUNTER — Encounter (INDEPENDENT_AMBULATORY_CARE_PROVIDER_SITE_OTHER): Payer: Self-pay | Admitting: Orthopaedic Surgery

## 2018-03-25 DIAGNOSIS — M25552 Pain in left hip: Secondary | ICD-10-CM | POA: Diagnosis not present

## 2018-03-25 DIAGNOSIS — M25551 Pain in right hip: Secondary | ICD-10-CM

## 2018-03-25 DIAGNOSIS — M4807 Spinal stenosis, lumbosacral region: Secondary | ICD-10-CM

## 2018-03-25 NOTE — Progress Notes (Signed)
The patient comes in today to review an MRI of his lumbar spine.  He is someone we performed a left total hip arthroplasty years ago and he is significant leg length discrepancy before surgery and we cannot overcome the discrepancy.  He has had no issues with the left hip.  He does get right hip pain in the groin but also sciatica on both sides.  His right hip x-rays were normal and the left hip showed a well-seated implant.  His pain seems more back related and plain films of lumbar spine show significant degenerative disc disease at multiple levels.  His biggest complaint is pain in the low back and sciatic region on the right side.  He does get some groin pain on the left side but again on physical exam and clinical exam his hip exams are normal.  MRI of his lumbar spine does show degenerative changes at multiple levels but they describe no neurocompression at all.  He has facet disease and significant degenerative disc disease with a lumbar scoliosis.  At this point I would like to send him to physical therapy to work on first modalities to get his core stronger and to get his back stronger.  They can work on any modalities increase his pain including e-stim and iontophoresis.  We would not send him for any type of ESI due to the fact that he has such poor retinal disease that his eye doctor said no more steroids ever for him.  Hopefully therapy will work.  All questions concerns were answered and addressed.  We will give him a prescription for outpatient physical therapy in his area.  We will see him back in 6 weeks to see how is doing overall.

## 2018-03-29 DIAGNOSIS — M2560 Stiffness of unspecified joint, not elsewhere classified: Secondary | ICD-10-CM | POA: Diagnosis not present

## 2018-03-29 DIAGNOSIS — M545 Low back pain: Secondary | ICD-10-CM | POA: Diagnosis not present

## 2018-03-29 DIAGNOSIS — R2689 Other abnormalities of gait and mobility: Secondary | ICD-10-CM | POA: Diagnosis not present

## 2018-03-31 DIAGNOSIS — M545 Low back pain: Secondary | ICD-10-CM | POA: Diagnosis not present

## 2018-03-31 DIAGNOSIS — M2560 Stiffness of unspecified joint, not elsewhere classified: Secondary | ICD-10-CM | POA: Diagnosis not present

## 2018-03-31 DIAGNOSIS — R2689 Other abnormalities of gait and mobility: Secondary | ICD-10-CM | POA: Diagnosis not present

## 2018-04-06 DIAGNOSIS — M2560 Stiffness of unspecified joint, not elsewhere classified: Secondary | ICD-10-CM | POA: Diagnosis not present

## 2018-04-06 DIAGNOSIS — R2689 Other abnormalities of gait and mobility: Secondary | ICD-10-CM | POA: Diagnosis not present

## 2018-04-06 DIAGNOSIS — M545 Low back pain: Secondary | ICD-10-CM | POA: Diagnosis not present

## 2018-04-09 DIAGNOSIS — R2689 Other abnormalities of gait and mobility: Secondary | ICD-10-CM | POA: Diagnosis not present

## 2018-04-09 DIAGNOSIS — M2560 Stiffness of unspecified joint, not elsewhere classified: Secondary | ICD-10-CM | POA: Diagnosis not present

## 2018-04-09 DIAGNOSIS — M545 Low back pain: Secondary | ICD-10-CM | POA: Diagnosis not present

## 2018-04-16 DIAGNOSIS — R2689 Other abnormalities of gait and mobility: Secondary | ICD-10-CM | POA: Diagnosis not present

## 2018-04-16 DIAGNOSIS — M545 Low back pain: Secondary | ICD-10-CM | POA: Diagnosis not present

## 2018-04-16 DIAGNOSIS — M2560 Stiffness of unspecified joint, not elsewhere classified: Secondary | ICD-10-CM | POA: Diagnosis not present

## 2018-04-19 DIAGNOSIS — M2560 Stiffness of unspecified joint, not elsewhere classified: Secondary | ICD-10-CM | POA: Diagnosis not present

## 2018-04-19 DIAGNOSIS — M545 Low back pain: Secondary | ICD-10-CM | POA: Diagnosis not present

## 2018-04-19 DIAGNOSIS — R2689 Other abnormalities of gait and mobility: Secondary | ICD-10-CM | POA: Diagnosis not present

## 2018-04-23 DIAGNOSIS — R2689 Other abnormalities of gait and mobility: Secondary | ICD-10-CM | POA: Diagnosis not present

## 2018-04-23 DIAGNOSIS — M2560 Stiffness of unspecified joint, not elsewhere classified: Secondary | ICD-10-CM | POA: Diagnosis not present

## 2018-04-23 DIAGNOSIS — M545 Low back pain: Secondary | ICD-10-CM | POA: Diagnosis not present

## 2018-04-27 DIAGNOSIS — R2689 Other abnormalities of gait and mobility: Secondary | ICD-10-CM | POA: Diagnosis not present

## 2018-04-27 DIAGNOSIS — M2560 Stiffness of unspecified joint, not elsewhere classified: Secondary | ICD-10-CM | POA: Diagnosis not present

## 2018-04-27 DIAGNOSIS — M545 Low back pain: Secondary | ICD-10-CM | POA: Diagnosis not present

## 2018-04-30 DIAGNOSIS — M545 Low back pain: Secondary | ICD-10-CM | POA: Diagnosis not present

## 2018-04-30 DIAGNOSIS — M2560 Stiffness of unspecified joint, not elsewhere classified: Secondary | ICD-10-CM | POA: Diagnosis not present

## 2018-04-30 DIAGNOSIS — R2689 Other abnormalities of gait and mobility: Secondary | ICD-10-CM | POA: Diagnosis not present

## 2018-05-03 DIAGNOSIS — H353131 Nonexudative age-related macular degeneration, bilateral, early dry stage: Secondary | ICD-10-CM | POA: Diagnosis not present

## 2018-05-03 DIAGNOSIS — H26493 Other secondary cataract, bilateral: Secondary | ICD-10-CM | POA: Diagnosis not present

## 2018-05-04 DIAGNOSIS — R2689 Other abnormalities of gait and mobility: Secondary | ICD-10-CM | POA: Diagnosis not present

## 2018-05-04 DIAGNOSIS — M545 Low back pain: Secondary | ICD-10-CM | POA: Diagnosis not present

## 2018-05-04 DIAGNOSIS — M2560 Stiffness of unspecified joint, not elsewhere classified: Secondary | ICD-10-CM | POA: Diagnosis not present

## 2018-05-06 ENCOUNTER — Encounter (INDEPENDENT_AMBULATORY_CARE_PROVIDER_SITE_OTHER): Payer: Self-pay | Admitting: Orthopaedic Surgery

## 2018-05-06 ENCOUNTER — Ambulatory Visit (INDEPENDENT_AMBULATORY_CARE_PROVIDER_SITE_OTHER): Payer: Medicare Other | Admitting: Orthopaedic Surgery

## 2018-05-06 DIAGNOSIS — M5441 Lumbago with sciatica, right side: Secondary | ICD-10-CM | POA: Diagnosis not present

## 2018-05-06 DIAGNOSIS — G8929 Other chronic pain: Secondary | ICD-10-CM

## 2018-05-06 DIAGNOSIS — M4807 Spinal stenosis, lumbosacral region: Secondary | ICD-10-CM

## 2018-05-06 NOTE — Progress Notes (Signed)
The patient is following up after having extensive physical therapy on his lumbar spine.  He is 73 years old and he says that it has helped in terms of stiffness but is very painful therapy to go through.  On exam he does look more limber and is getting around better overall.  He understands that weight loss and core strengthening and stretching around to help.  He has a few more therapy visits and he understands he needs to do this on a daily basis at home.  All question concerns were answered and addressed.  Follow-up will be as needed.  Obviously if he ends up needing more therapy at some point he will let us know because we should reinstitute that if needed.

## 2018-05-07 DIAGNOSIS — M545 Low back pain: Secondary | ICD-10-CM | POA: Diagnosis not present

## 2018-05-07 DIAGNOSIS — R2689 Other abnormalities of gait and mobility: Secondary | ICD-10-CM | POA: Diagnosis not present

## 2018-05-07 DIAGNOSIS — M2560 Stiffness of unspecified joint, not elsewhere classified: Secondary | ICD-10-CM | POA: Diagnosis not present

## 2018-05-11 DIAGNOSIS — R2689 Other abnormalities of gait and mobility: Secondary | ICD-10-CM | POA: Diagnosis not present

## 2018-05-11 DIAGNOSIS — M2560 Stiffness of unspecified joint, not elsewhere classified: Secondary | ICD-10-CM | POA: Diagnosis not present

## 2018-05-11 DIAGNOSIS — M545 Low back pain: Secondary | ICD-10-CM | POA: Diagnosis not present

## 2018-05-14 DIAGNOSIS — M545 Low back pain: Secondary | ICD-10-CM | POA: Diagnosis not present

## 2018-05-14 DIAGNOSIS — M2560 Stiffness of unspecified joint, not elsewhere classified: Secondary | ICD-10-CM | POA: Diagnosis not present

## 2018-05-14 DIAGNOSIS — R2689 Other abnormalities of gait and mobility: Secondary | ICD-10-CM | POA: Diagnosis not present

## 2018-05-18 DIAGNOSIS — R2689 Other abnormalities of gait and mobility: Secondary | ICD-10-CM | POA: Diagnosis not present

## 2018-05-18 DIAGNOSIS — M545 Low back pain: Secondary | ICD-10-CM | POA: Diagnosis not present

## 2018-05-18 DIAGNOSIS — M2560 Stiffness of unspecified joint, not elsewhere classified: Secondary | ICD-10-CM | POA: Diagnosis not present

## 2018-05-21 DIAGNOSIS — R2689 Other abnormalities of gait and mobility: Secondary | ICD-10-CM | POA: Diagnosis not present

## 2018-05-21 DIAGNOSIS — M2560 Stiffness of unspecified joint, not elsewhere classified: Secondary | ICD-10-CM | POA: Diagnosis not present

## 2018-05-21 DIAGNOSIS — M545 Low back pain: Secondary | ICD-10-CM | POA: Diagnosis not present

## 2018-05-24 DIAGNOSIS — M545 Low back pain: Secondary | ICD-10-CM | POA: Diagnosis not present

## 2018-05-24 DIAGNOSIS — R2689 Other abnormalities of gait and mobility: Secondary | ICD-10-CM | POA: Diagnosis not present

## 2018-05-24 DIAGNOSIS — M2560 Stiffness of unspecified joint, not elsewhere classified: Secondary | ICD-10-CM | POA: Diagnosis not present

## 2018-08-30 DIAGNOSIS — H353131 Nonexudative age-related macular degeneration, bilateral, early dry stage: Secondary | ICD-10-CM | POA: Diagnosis not present

## 2018-10-20 DIAGNOSIS — M159 Polyosteoarthritis, unspecified: Secondary | ICD-10-CM | POA: Diagnosis not present

## 2018-10-20 DIAGNOSIS — I10 Essential (primary) hypertension: Secondary | ICD-10-CM | POA: Diagnosis not present

## 2018-10-20 DIAGNOSIS — Z0000 Encounter for general adult medical examination without abnormal findings: Secondary | ICD-10-CM | POA: Diagnosis not present

## 2018-10-20 DIAGNOSIS — B001 Herpesviral vesicular dermatitis: Secondary | ICD-10-CM | POA: Diagnosis not present

## 2018-10-20 DIAGNOSIS — E782 Mixed hyperlipidemia: Secondary | ICD-10-CM | POA: Diagnosis not present

## 2018-10-25 DIAGNOSIS — R739 Hyperglycemia, unspecified: Secondary | ICD-10-CM | POA: Diagnosis not present

## 2018-10-25 DIAGNOSIS — E782 Mixed hyperlipidemia: Secondary | ICD-10-CM | POA: Diagnosis not present

## 2018-10-25 DIAGNOSIS — Z125 Encounter for screening for malignant neoplasm of prostate: Secondary | ICD-10-CM | POA: Diagnosis not present

## 2018-10-25 DIAGNOSIS — Z79899 Other long term (current) drug therapy: Secondary | ICD-10-CM | POA: Diagnosis not present

## 2018-11-01 DIAGNOSIS — L84 Corns and callosities: Secondary | ICD-10-CM | POA: Diagnosis not present

## 2018-11-01 DIAGNOSIS — L600 Ingrowing nail: Secondary | ICD-10-CM | POA: Diagnosis not present

## 2018-11-01 DIAGNOSIS — L603 Nail dystrophy: Secondary | ICD-10-CM | POA: Diagnosis not present

## 2018-12-01 DIAGNOSIS — T671XXA Heat syncope, initial encounter: Secondary | ICD-10-CM | POA: Diagnosis not present

## 2018-12-01 DIAGNOSIS — T679XXA Effect of heat and light, unspecified, initial encounter: Secondary | ICD-10-CM | POA: Diagnosis not present

## 2018-12-01 DIAGNOSIS — E875 Hyperkalemia: Secondary | ICD-10-CM | POA: Diagnosis not present

## 2018-12-01 DIAGNOSIS — E669 Obesity, unspecified: Secondary | ICD-10-CM | POA: Diagnosis not present

## 2018-12-01 DIAGNOSIS — E860 Dehydration: Secondary | ICD-10-CM | POA: Diagnosis not present

## 2018-12-01 DIAGNOSIS — I10 Essential (primary) hypertension: Secondary | ICD-10-CM | POA: Diagnosis not present

## 2018-12-01 DIAGNOSIS — R55 Syncope and collapse: Secondary | ICD-10-CM | POA: Diagnosis not present

## 2018-12-01 DIAGNOSIS — E785 Hyperlipidemia, unspecified: Secondary | ICD-10-CM | POA: Diagnosis not present

## 2018-12-01 DIAGNOSIS — R0902 Hypoxemia: Secondary | ICD-10-CM | POA: Diagnosis not present

## 2018-12-01 DIAGNOSIS — N179 Acute kidney failure, unspecified: Secondary | ICD-10-CM | POA: Diagnosis not present

## 2018-12-01 DIAGNOSIS — R51 Headache: Secondary | ICD-10-CM | POA: Diagnosis not present

## 2018-12-01 DIAGNOSIS — T6701XA Heatstroke and sunstroke, initial encounter: Secondary | ICD-10-CM | POA: Diagnosis not present

## 2018-12-08 DIAGNOSIS — I6523 Occlusion and stenosis of bilateral carotid arteries: Secondary | ICD-10-CM | POA: Diagnosis not present

## 2018-12-08 DIAGNOSIS — E782 Mixed hyperlipidemia: Secondary | ICD-10-CM | POA: Diagnosis not present

## 2018-12-08 DIAGNOSIS — I679 Cerebrovascular disease, unspecified: Secondary | ICD-10-CM | POA: Diagnosis not present

## 2018-12-08 DIAGNOSIS — X30XXXS Exposure to excessive natural heat, sequela: Secondary | ICD-10-CM | POA: Diagnosis not present

## 2018-12-22 DIAGNOSIS — K5730 Diverticulosis of large intestine without perforation or abscess without bleeding: Secondary | ICD-10-CM | POA: Diagnosis not present

## 2019-02-11 DIAGNOSIS — Z6833 Body mass index (BMI) 33.0-33.9, adult: Secondary | ICD-10-CM | POA: Diagnosis not present

## 2019-02-11 DIAGNOSIS — I10 Essential (primary) hypertension: Secondary | ICD-10-CM | POA: Diagnosis not present

## 2019-02-11 DIAGNOSIS — R1032 Left lower quadrant pain: Secondary | ICD-10-CM | POA: Diagnosis not present

## 2020-04-19 DIAGNOSIS — I10 Essential (primary) hypertension: Secondary | ICD-10-CM | POA: Diagnosis not present

## 2020-04-19 DIAGNOSIS — E782 Mixed hyperlipidemia: Secondary | ICD-10-CM | POA: Diagnosis not present

## 2020-04-23 DIAGNOSIS — R748 Abnormal levels of other serum enzymes: Secondary | ICD-10-CM | POA: Diagnosis not present

## 2020-05-15 DIAGNOSIS — Z20822 Contact with and (suspected) exposure to covid-19: Secondary | ICD-10-CM | POA: Diagnosis not present

## 2020-05-20 DIAGNOSIS — E782 Mixed hyperlipidemia: Secondary | ICD-10-CM | POA: Diagnosis not present

## 2020-05-20 DIAGNOSIS — M1990 Unspecified osteoarthritis, unspecified site: Secondary | ICD-10-CM | POA: Diagnosis not present

## 2020-05-20 DIAGNOSIS — I10 Essential (primary) hypertension: Secondary | ICD-10-CM | POA: Diagnosis not present

## 2020-05-21 DIAGNOSIS — E782 Mixed hyperlipidemia: Secondary | ICD-10-CM | POA: Diagnosis not present

## 2020-05-21 DIAGNOSIS — Z23 Encounter for immunization: Secondary | ICD-10-CM | POA: Diagnosis not present

## 2020-05-22 DIAGNOSIS — D124 Benign neoplasm of descending colon: Secondary | ICD-10-CM | POA: Diagnosis not present

## 2020-05-22 DIAGNOSIS — Z1211 Encounter for screening for malignant neoplasm of colon: Secondary | ICD-10-CM | POA: Diagnosis not present

## 2020-05-22 DIAGNOSIS — K644 Residual hemorrhoidal skin tags: Secondary | ICD-10-CM | POA: Diagnosis not present

## 2020-05-22 DIAGNOSIS — D126 Benign neoplasm of colon, unspecified: Secondary | ICD-10-CM | POA: Diagnosis not present

## 2020-05-22 DIAGNOSIS — Z86010 Personal history of colonic polyps: Secondary | ICD-10-CM | POA: Diagnosis not present

## 2020-05-22 DIAGNOSIS — K5730 Diverticulosis of large intestine without perforation or abscess without bleeding: Secondary | ICD-10-CM | POA: Diagnosis not present

## 2020-05-22 DIAGNOSIS — K635 Polyp of colon: Secondary | ICD-10-CM | POA: Diagnosis not present

## 2020-06-18 DIAGNOSIS — E782 Mixed hyperlipidemia: Secondary | ICD-10-CM | POA: Diagnosis not present

## 2020-06-18 DIAGNOSIS — I10 Essential (primary) hypertension: Secondary | ICD-10-CM | POA: Diagnosis not present

## 2020-08-02 DIAGNOSIS — Z9181 History of falling: Secondary | ICD-10-CM | POA: Diagnosis not present

## 2020-08-02 DIAGNOSIS — Z0000 Encounter for general adult medical examination without abnormal findings: Secondary | ICD-10-CM | POA: Diagnosis not present

## 2020-08-02 DIAGNOSIS — M159 Polyosteoarthritis, unspecified: Secondary | ICD-10-CM | POA: Diagnosis not present

## 2020-08-02 DIAGNOSIS — Z6836 Body mass index (BMI) 36.0-36.9, adult: Secondary | ICD-10-CM | POA: Diagnosis not present

## 2020-08-18 DIAGNOSIS — E782 Mixed hyperlipidemia: Secondary | ICD-10-CM | POA: Diagnosis not present

## 2020-08-18 DIAGNOSIS — I10 Essential (primary) hypertension: Secondary | ICD-10-CM | POA: Diagnosis not present

## 2020-09-17 DIAGNOSIS — I10 Essential (primary) hypertension: Secondary | ICD-10-CM | POA: Diagnosis not present

## 2020-09-17 DIAGNOSIS — E782 Mixed hyperlipidemia: Secondary | ICD-10-CM | POA: Diagnosis not present

## 2020-10-17 DIAGNOSIS — E782 Mixed hyperlipidemia: Secondary | ICD-10-CM | POA: Diagnosis not present

## 2020-10-18 DIAGNOSIS — I10 Essential (primary) hypertension: Secondary | ICD-10-CM | POA: Diagnosis not present

## 2020-10-18 DIAGNOSIS — E782 Mixed hyperlipidemia: Secondary | ICD-10-CM | POA: Diagnosis not present

## 2020-10-18 DIAGNOSIS — Z23 Encounter for immunization: Secondary | ICD-10-CM | POA: Diagnosis not present

## 2020-12-03 DIAGNOSIS — Z471 Aftercare following joint replacement surgery: Secondary | ICD-10-CM | POA: Diagnosis not present

## 2020-12-03 DIAGNOSIS — Z96642 Presence of left artificial hip joint: Secondary | ICD-10-CM | POA: Diagnosis not present

## 2020-12-03 DIAGNOSIS — M1611 Unilateral primary osteoarthritis, right hip: Secondary | ICD-10-CM | POA: Diagnosis not present

## 2020-12-03 DIAGNOSIS — M47816 Spondylosis without myelopathy or radiculopathy, lumbar region: Secondary | ICD-10-CM | POA: Diagnosis not present

## 2020-12-03 DIAGNOSIS — R590 Localized enlarged lymph nodes: Secondary | ICD-10-CM | POA: Diagnosis not present

## 2020-12-03 DIAGNOSIS — M545 Low back pain, unspecified: Secondary | ICD-10-CM | POA: Diagnosis not present

## 2020-12-03 DIAGNOSIS — S76312A Strain of muscle, fascia and tendon of the posterior muscle group at thigh level, left thigh, initial encounter: Secondary | ICD-10-CM | POA: Diagnosis not present

## 2020-12-03 DIAGNOSIS — S76012A Strain of muscle, fascia and tendon of left hip, initial encounter: Secondary | ICD-10-CM | POA: Diagnosis not present

## 2020-12-03 DIAGNOSIS — M5136 Other intervertebral disc degeneration, lumbar region: Secondary | ICD-10-CM | POA: Diagnosis not present

## 2020-12-03 DIAGNOSIS — M25552 Pain in left hip: Secondary | ICD-10-CM | POA: Diagnosis not present

## 2020-12-10 DIAGNOSIS — S76012A Strain of muscle, fascia and tendon of left hip, initial encounter: Secondary | ICD-10-CM | POA: Diagnosis not present

## 2020-12-10 DIAGNOSIS — M1611 Unilateral primary osteoarthritis, right hip: Secondary | ICD-10-CM | POA: Diagnosis not present

## 2020-12-10 DIAGNOSIS — Z6836 Body mass index (BMI) 36.0-36.9, adult: Secondary | ICD-10-CM | POA: Diagnosis not present

## 2020-12-19 DIAGNOSIS — E782 Mixed hyperlipidemia: Secondary | ICD-10-CM | POA: Diagnosis not present

## 2020-12-19 DIAGNOSIS — I10 Essential (primary) hypertension: Secondary | ICD-10-CM | POA: Diagnosis not present

## 2020-12-25 ENCOUNTER — Encounter: Payer: Self-pay | Admitting: Orthopaedic Surgery

## 2020-12-25 ENCOUNTER — Ambulatory Visit (INDEPENDENT_AMBULATORY_CARE_PROVIDER_SITE_OTHER): Payer: Medicare Other | Admitting: Orthopaedic Surgery

## 2020-12-25 VITALS — Ht 68.0 in | Wt 249.8 lb

## 2020-12-25 DIAGNOSIS — M1611 Unilateral primary osteoarthritis, right hip: Secondary | ICD-10-CM | POA: Diagnosis not present

## 2020-12-25 DIAGNOSIS — M25552 Pain in left hip: Secondary | ICD-10-CM | POA: Diagnosis not present

## 2020-12-25 NOTE — Progress Notes (Signed)
HPI: Lee Torres 75 year old male well-known to Dr. Creta Levin service with seen while comes in today with left hip pain.  He was seen in ER and aspirin brings reports from the ER MRI of his pelvis without contrast performed on 12/03/2020 and left iliac muscle high-grade partial-thickness tear and severe osteoarthritis of the right hip.  He also underwent an MRI of his lumbar spine which showed multiple level degenerative changes.  The left subarticular recess narrowing that had the potential to compress the left L3 nerve root.  Images are unavailable. Patient denies any injury to either hip.  Does state that he had some off-and-on pain in the left hip region for 6 to 8 months before it got worse.  No radicular symptoms down either leg.  Periodically been taking muscle relaxant but has not taken this for the last few days.  Right hip no significant pain.  He notes no decreased range of motion of the right hip.  Review of systems: See HPI otherwise negative  Physical exam: Weight 249 pounds height 5 foot 8 inches BMI is 37.99  General: Well-developed well-nourished male no acute distress ambulates without any assistive device. Psych: Alert and oriented x3 Right hip overall good range of motion some discomfort with extremes of internal and external rotation.  Left hip limited internal and external rotation with discomfort.  Impression: Left iliac muscle tear Right hip osteoarthritis  Plan: Discussed with him with refrain from heavy lifting pushing pulling and squatting until the pain resolves in the left hand.  In regards to the right hip is having no significant pain in the right hip and therefore would not recommend any type of conservative treatment or surgical treatment.  If he does develop right hip pain he can always return weekly for need to obtain an AP pelvis and lateral view of the right hip.  Questions were encouraged and answered by Dr. Ninfa Linden and myself.

## 2021-03-01 DIAGNOSIS — E782 Mixed hyperlipidemia: Secondary | ICD-10-CM | POA: Diagnosis not present

## 2021-03-01 DIAGNOSIS — I10 Essential (primary) hypertension: Secondary | ICD-10-CM | POA: Diagnosis not present

## 2021-03-01 DIAGNOSIS — K760 Fatty (change of) liver, not elsewhere classified: Secondary | ICD-10-CM | POA: Diagnosis not present

## 2021-03-01 DIAGNOSIS — Z6836 Body mass index (BMI) 36.0-36.9, adult: Secondary | ICD-10-CM | POA: Diagnosis not present

## 2021-03-25 DIAGNOSIS — H35711 Central serous chorioretinopathy, right eye: Secondary | ICD-10-CM | POA: Diagnosis not present

## 2021-05-29 ENCOUNTER — Ambulatory Visit (INDEPENDENT_AMBULATORY_CARE_PROVIDER_SITE_OTHER): Payer: Medicare Other | Admitting: Orthopaedic Surgery

## 2021-05-29 ENCOUNTER — Ambulatory Visit (INDEPENDENT_AMBULATORY_CARE_PROVIDER_SITE_OTHER): Payer: Medicare Other

## 2021-05-29 ENCOUNTER — Encounter: Payer: Self-pay | Admitting: Orthopaedic Surgery

## 2021-05-29 ENCOUNTER — Other Ambulatory Visit: Payer: Self-pay

## 2021-05-29 VITALS — Ht 68.0 in | Wt 252.8 lb

## 2021-05-29 DIAGNOSIS — M1611 Unilateral primary osteoarthritis, right hip: Secondary | ICD-10-CM | POA: Insufficient documentation

## 2021-05-29 DIAGNOSIS — M25551 Pain in right hip: Secondary | ICD-10-CM

## 2021-05-29 NOTE — Progress Notes (Signed)
The patient is very well-known to me.  He is a 76 year old gentleman who we replaced his left hip back in 2014.  Its been at least 9 years.  He had a leg length discrepancy because that left side was already significantly shortened and we could not really increase the leg length.  He has done very well with that left hip and occasionally get some muscle pain.  However his right hip is been hurting quite a bit now and it hurts in the groin.  It does radiate to his knees.  He is not a diabetic and all blood thinning medication.  He does have difficulties with eyesight.  We did x-ray his pelvis and right hip in 2019 that showed some joint space narrowing.  He currently denies any acute changes in medical status.  He does ambulate with a Trendelenburg gait.  He currently denies any headache, chest pain, shortness of breath, fever, chills, nausea, vomiting  On exam his left hip moves smoothly and fluidly.  His right hip has significant decrease in internal ex rotation as well as pain in the groin or rotation.  An AP pelvis and lateral the right hip shows severe end-stage arthritis at this point of his right hip and is significantly worsened over the last 2 and half years.  The joint space is bone-on-bone and there are osteophytes around the hip.  At this point we are recommending a hip replacement for his right side.  He cannot have steroid injections anymore due to the fact that his had detrimentally on his eyesight.  We talked in length in detail about hip replacement surgery and having had this before he is fully aware of what to expect with the risks and benefits of surgery and what to expect through an intraoperative and postoperative course.  We will work on getting this scheduled sometime later this spring.  We will be in touch.

## 2021-07-04 ENCOUNTER — Other Ambulatory Visit: Payer: Self-pay

## 2021-07-09 NOTE — Progress Notes (Signed)
Sent message, via epic in basket, requesting orders in epic from surgeon.  

## 2021-07-16 NOTE — Progress Notes (Signed)
Need orders in epic.  PReop on 07/19/21.   ?

## 2021-07-16 NOTE — Progress Notes (Addendum)
Anesthesia Review: ? ?PCP: DR Lovette Cliche at [facility]  ?Cardiologist : none  ?Chest x-ray : ?EKG :07/19/21  ?Echo : ?Stress test: ?Cardiac Cath :  ?Activity level: can do a flight of stairs without difficulty  ?Sleep Study/ CPAP : none  ?Fasting Blood Sugar :      / Checks Blood Sugar -- times a day:   ?Blood Thinner/ Instructions /Last Dose: ?ASA / Instructions/ Last Dose :   ?Initial blood pressure at preop was 162/103.  Pt has not taken am blood pressure meds this am.. Denies any chest pain, dizziness, shortness of breath or blurred vision.  Recheck of blood pressure 20 minutes was 156/92 ?

## 2021-07-17 ENCOUNTER — Other Ambulatory Visit: Payer: Self-pay | Admitting: Physician Assistant

## 2021-07-17 DIAGNOSIS — M1611 Unilateral primary osteoarthritis, right hip: Secondary | ICD-10-CM

## 2021-07-18 NOTE — Progress Notes (Addendum)
DUE TO COVID-19 ONLY 2 VISITOR IS ALLOWED TO COME WITH YOU AND STAY IN THE WAITING ROOM ONLY DURING PRE OP AND PROCEDURE DAY OF SURGERY.  4  VISITOR  MAY VISIT WITH YOU AFTER SURGERY IN YOUR PRIVATE ROOM DURING VISITING HOURS ONLY! ?YOU MAY HAVE ONE PERSON SPEND THE NITE WITH YOU IN YOUR ROOM AFTER SURGERY.   ? ? ? ? Your procedure is scheduled on:  ? 08/01/2021  ? Report to [facility] Main  Entrance ? ? Report to admitting at       0515         AM ?DO NOT [place], PICTURE ID OR WALLET DAY OF SURGERY.  ?  ? ? Call this number if you have problems the morning of surgery [phone]  ? ? REMEMBER: NO  SOLID FOODS , CANDY, GUM OR MINTS AFTER MIDNITE THE NITE BEFORE SURGERY .       [place] [facility] CLEAR LIQUIDS UNTIL       0430am          DAY OF SURGERY.      PLEASE FINISH ENSURE DRINK PER SURGEON ORDER  WHICH NEEDS TO BE COMPLETED AT      0430am     MORNING OF SURGERY.   ? ? ? ? ?CLEAR LIQUID DIET ? ? ?Foods Allowed      ?WATER ?BLACK COFFEE ( SUGAR OK, NO MILK, CREAM OR CREAMER) REGULAR AND DECAF  ?TEA ( SUGAR OK NO MILK, CREAM, OR CREAMER) REGULAR AND DECAF  ?PLAIN JELLO ( NO RED)  ?FRUIT ICES ( NO RED, NO FRUIT PULP)  ?POPSICLES ( NO RED)  ?JUICE- APPLE, WHITE GRAPE AND WHITE CRANBERRY  ?SPORT DRINK LIKE GATORADE ( NO RED)  ?CLEAR BROTH ( VEGETABLE , CHICKEN OR BEEF)                                                               ? ?    ? ?BRUSH YOUR TEETH MORNING OF SURGERY AND RINSE YOUR MOUTH OUT, NO CHEWING GUM CANDY OR MINTS. ?  ? ? Take these medicines the morning of surgery with A SIP OF WATER:  toprol  ?Amlodipine, proscar  ? ?DO NOT TAKE ANY DIABETIC MEDICATIONS DAY OF YOUR SURGERY ?                  ?            You may not have any metal on your body including hair pins and  ?            piercings  Do not wear jewelry, make-up, lotions, powders or perfumes, deodorant ?            Do not wear nail polish on your fingernails.   ?           IF YOU ARE A MALE AND WANT TO SHAVE UNDER ARMS OR LEGS  PRIOR TO SURGERY YOU MUST DO SO AT LEAST 48 HOURS PRIOR TO SURGERY.  ?            Men may shave face and neck. ? ? Do not bring valuables to the hospital. [facility] NOT ?            RESPONSIBLE  FOR VALUABLES. ? Contacts, dentures or bridgework may not be worn into surgery. ? Leave suitcase in the car. After surgery it may be brought to your room. ? ?  ? Patients discharged the day of surgery will not be allowed to drive home. IF YOU ARE HAVING SURGERY AND GOING HOME THE SAME DAY, YOU MUST HAVE AN ADULT TO DRIVE YOU HOME AND BE WITH YOU FOR 24 HOURS. YOU MAY GO HOME BY TAXI OR UBER OR ORTHERWISE, BUT AN ADULT MUST ACCOMPANY YOU HOME AND STAY WITH YOU FOR 24 HOURS. ?  ? ?            Please read over the following fact sheets you were given: ?_____________________________________________________________________ ? ?[place] - Preparing for Surgery ?Before surgery, you can play an important role.  Because skin is not sterile, your skin needs to be as free of germs as possible.  You can reduce the number of germs on your skin by washing with CHG (chlorahexidine gluconate) soap before surgery.  CHG is an antiseptic cleaner which kills germs and bonds with the skin to continue killing germs even after washing. ?Please DO NOT use if you have an allergy to CHG or antibacterial soaps.  If your skin becomes reddened/irritated stop using the CHG and inform your nurse when you arrive at Short Stay. ?Do not shave (including legs and underarms) for at least 48 hours prior to the first CHG shower.  You may shave your face/neck. ?Please follow these instructions carefully: ? 1.  Shower with CHG Soap the night before surgery and the  morning of Surgery. ? 2.  If you choose to wash your hair, wash your hair first as usual with your  normal  shampoo. ? 3.  After you shampoo, rinse your hair and body thoroughly to remove the  shampoo.                           4.  Use CHG as you would any other liquid soap.  You can apply chg  directly  to the skin and wash  ?                     Gently with a scrungie or clean washcloth. ? 5.  Apply the CHG Soap to your body ONLY FROM THE NECK DOWN.   Do not use on face/ open      ?                     Wound or open sores. Avoid contact with eyes, ears mouth and genitals (private parts).  ?                     Production manager,  Genitals (private parts) with your normal soap. ?            6.  Wash thoroughly, paying special attention to the area where your surgery  will be performed. ? 7.  Thoroughly rinse your body with warm water from the neck down. ? 8.  DO NOT shower/wash with your normal soap after using and rinsing off  the CHG Soap. ?               9.  Pat yourself dry with a clean towel. ?           10.  Wear clean pajamas. ?  11.  Place clean sheets on your bed the night of your first shower and do not  sleep with pets. ?Day of Surgery : ?Do not apply any lotions/deodorants the morning of surgery.  Please wear clean clothes to the hospital/surgery center. ? ?FAILURE TO FOLLOW THESE INSTRUCTIONS MAY RESULT IN THE CANCELLATION OF YOUR SURGERY ?PATIENT SIGNATURE_________________________________ ? ?NURSE SIGNATURE__________________________________ ? ?________________________________________________________________________  ? ? ?           ?

## 2021-07-19 ENCOUNTER — Other Ambulatory Visit: Payer: Self-pay

## 2021-07-19 ENCOUNTER — Encounter (HOSPITAL_COMMUNITY): Payer: Self-pay

## 2021-07-19 ENCOUNTER — Encounter (HOSPITAL_COMMUNITY)
Admission: RE | Admit: 2021-07-19 | Discharge: 2021-07-19 | Disposition: A | Discharge status: Home / Self Care | Payer: Medicare Other | Source: Ambulatory Visit | Attending: Orthopaedic Surgery | Admitting: Orthopaedic Surgery

## 2021-07-19 VITALS — BP 156/92 | HR 60 | Temp 97.9°F | Resp 16 | Ht 67.0 in | Wt 249.0 lb

## 2021-07-19 DIAGNOSIS — Z01818 Encounter for other preprocedural examination: Secondary | ICD-10-CM | POA: Diagnosis not present

## 2021-07-19 DIAGNOSIS — M1611 Unilateral primary osteoarthritis, right hip: Secondary | ICD-10-CM

## 2021-07-19 HISTORY — DX: Headache, unspecified: R51.9

## 2021-07-19 HISTORY — DX: Legal blindness, as defined in USA: H54.8

## 2021-07-19 LAB — CBC
HCT: 48.2 % (ref 39.0–52.0)
Hemoglobin: 16 g/dL (ref 13.0–17.0)
MCH: 32.1 pg (ref 26.0–34.0)
MCHC: 33.2 g/dL (ref 30.0–36.0)
MCV: 96.6 fL (ref 80.0–100.0)
Platelets: 211 10*3/uL (ref 150–400)
RBC: 4.99 MIL/uL (ref 4.22–5.81)
RDW: 12.8 % (ref 11.5–15.5)
WBC: 5.1 10*3/uL (ref 4.0–10.5)
nRBC: 0 % (ref 0.0–0.2)

## 2021-07-19 LAB — BASIC METABOLIC PANEL
Anion gap: 7 (ref 5–15)
BUN: 18 mg/dL (ref 8–23)
CO2: 25 mmol/L (ref 22–32)
Calcium: 9.5 mg/dL (ref 8.9–10.3)
Chloride: 106 mmol/L (ref 98–111)
Creatinine, Ser: 0.96 mg/dL (ref 0.61–1.24)
GFR, Estimated: >60 mL/min (ref >60)
Glucose, Bld: 92 mg/dL (ref 70–99)
Potassium: 4.1 mmol/L (ref 3.5–5.1)
Sodium: 138 mmol/L (ref 135–145)

## 2021-07-19 LAB — TYPE AND SCREEN
ABO/RH(D): O NEG
Antibody Screen: NEGATIVE

## 2021-07-19 LAB — SURGICAL PCR SCREEN
MRSA, PCR: NEGATIVE
Staphylococcus aureus: NEGATIVE

## 2021-07-31 ENCOUNTER — Telehealth: Payer: Self-pay | Admitting: *Deleted

## 2021-07-31 NOTE — Telephone Encounter (Signed)
Ortho bundle Pre-op call completed. 

## 2021-07-31 NOTE — H&P (Signed)
TOTAL HIP ADMISSION H&P ? ?Patient is admitted for right total hip arthroplasty. ? ?Subjective: ? ?Chief Complaint: right hip pain ? ?HPI: Lee Torres, 76 y.o. male, has a history of pain and functional disability in the right hip(s) due to arthritis and patient has failed non-surgical conservative treatments for greater than 12 weeks to include NSAID's and/or analgesics, use of assistive devices, weight reduction as appropriate, and activity modification.  Onset of symptoms was gradual starting 2 years ago with gradually worsening course since that time.The patient noted no past surgery on the right hip(s).  Patient currently rates pain in the right hip at 10 out of 10 with activity. Patient has night pain, worsening of pain with activity and weight bearing, trendelenberg gait, pain that interfers with activities of daily living, and pain with passive range of motion. Patient has evidence of subchondral sclerosis, periarticular osteophytes, and joint space narrowing by imaging studies. This condition presents safety issues increasing the risk of falls.  There is no current active infection. ? ?Patient Active Problem List  ? Diagnosis Date Noted  ? Unilateral primary osteoarthritis, right hip 05/29/2021  ? Spinal stenosis of lumbosacral region 03/02/2018  ? Degenerative arthritis of hip 05/07/2012  ? ?Past Medical History:  ?Diagnosis Date  ? Arthritis   ? oa  ? Elevated cholesterol   ? Headache   ? Hypertension   ? Inguinal hernia   ? left side, repaired unsuccessfully 2 yrs ago  ? Legally blind in right eye, as defined in [place]   ?  ?Past Surgical History:  ?Procedure Laterality Date  ? HERNIA REPAIR  2011  ? TOTAL HIP ARTHROPLASTY  05/07/2012  ? Procedure: TOTAL HIP ARTHROPLASTY ANTERIOR APPROACH;  Surgeon: Mcarthur Rossetti, MD;  Location: [facility];  Service: Orthopedics;  Laterality: Left;  ?  ?No current facility-administered medications for this encounter.  ? ?Current Outpatient Medications  ?Medication Sig  Dispense Refill Last Dose  ? amLODipine (NORVASC) 10 MG tablet Take 10 mg by mouth daily.     ? Aspirin-Acetaminophen (GOODYS BODY PAIN PO) Take 1 Package by mouth daily as needed (pain).     ? Coenzyme Q10-Vitamin E (QUNOL ULTRA COQ10 PO) Take 1 capsule by mouth daily.     ? ezetimibe (ZETIA) 10 MG tablet Take 10 mg by mouth daily.     ? fenofibrate (TRICOR) 48 MG tablet Take 48 mg by mouth at bedtime.     ? finasteride (PROSCAR) 5 MG tablet Take 5 mg by mouth daily.     ? lisinopril (ZESTRIL) 40 MG tablet Take 40 mg by mouth daily.     ? metoprolol succinate (TOPROL-XL) 25 MG 24 hr tablet Take 25 mg by mouth daily before breakfast. Take with or immediately following a meal.     ? Multiple Vitamins-Minerals (PRESERVISION AREDS 2 PO) Take 1 capsule by mouth in the morning and at bedtime.     ? rosuvastatin (CRESTOR) 20 MG tablet Take 20 mg by mouth every Friday.     ? traMADol (ULTRAM) 50 MG tablet Take 50 mg by mouth daily.     ? aspirin EC 325 MG EC tablet Take 1 tablet (325 mg total) by mouth 2 (two) times daily. (Patient not taking: Reported on 07/16/2021) 60 tablet 0 Not Taking  ? docusate sodium 100 MG CAPS Take 100 mg by mouth 2 (two) times daily. (Patient not taking: Reported on 07/16/2021) 10 capsule 0 Not Taking  ? ferrous sulfate 325 (65 FE) MG tablet Take  1 tablet (325 mg total) by mouth 3 (three) times daily after meals. (Patient not taking: Reported on 07/16/2021) 60 tablet 0 Not Taking  ? Iron-Vitamins (GERITOL COMPLETE PO) Take 1 tablet by mouth every 7 (seven) days.     ? methocarbamol (ROBAXIN) 500 MG tablet Take 1 tablet (500 mg total) by mouth every 6 (six) hours as needed. (Patient not taking: Reported on 07/16/2021) 60 tablet 0 Not Taking  ? ?Allergies  ?Allergen Reactions  ? Anacin [Aspirin-Caffeine]   ?  Can't remember the reaction but think it would kill him if he took it again  ?  ?Social History  ? ?Tobacco Use  ? Smoking status: Never  ? Smokeless tobacco: Former  ?Substance Use Topics  ?  Alcohol use: No  ?  ?No family history on file.  ? ?Review of Systems  ?Musculoskeletal:  Positive for gait problem.  ?All other systems reviewed and are negative. ? ?Objective: ? ?Physical Exam ?Vitals reviewed.  ?Constitutional:   ?   Appearance: Normal appearance.  ?Eyes:  ?   Extraocular Movements: Extraocular movements intact.  ?   Pupils: Pupils are equal, round, and reactive to light.  ?Cardiovascular:  ?   Rate and Rhythm: Normal rate.  ?   Pulses: Normal pulses.  ?Pulmonary:  ?   Effort: Pulmonary effort is normal.  ?   Breath sounds: Normal breath sounds.  ?Abdominal:  ?   Palpations: Abdomen is soft.  ?Musculoskeletal:  ?   Cervical back: Normal range of motion and neck supple.  ?   Right hip: Tenderness and bony tenderness present. Decreased range of motion. Decreased strength.  ?Neurological:  ?   Mental Status: He is alert and oriented to person, place, and time.  ?Psychiatric:     ?   Behavior: Behavior normal.  ? ? ?Vital signs in last 24 hours: ?  ? ?Labs: ? ? ?Estimated body mass index is 39 kg/m? as calculated from the following: ?  Height as of 07/19/21: '5\' 7"'$  (1.702 m). ?  Weight as of 07/19/21: 112.9 kg. ? ? ?Imaging Review ?Plain radiographs demonstrate severe degenerative joint disease of the right hip(s). The bone quality appears to be good for age and reported activity level. ? ? ? ? ? ?Assessment/Plan: ? ?End stage arthritis, right hip(s) ? ?The patient history, physical examination, clinical judgement of the provider and imaging studies are consistent with end stage degenerative joint disease of the right hip(s) and total hip arthroplasty is deemed medically necessary. The treatment options including medical management, injection therapy, arthroscopy and arthroplasty were discussed at length. The risks and benefits of total hip arthroplasty were presented and reviewed. The risks due to aseptic loosening, infection, stiffness, dislocation/subluxation,  thromboembolic complications and other  imponderables were discussed.  The patient acknowledged the explanation, agreed to proceed with the plan and consent was signed. Patient is being admitted for inpatient treatment for surgery, pain control, PT, OT, prophylactic antibiotics, VTE prophylaxis, progressive ambulation and ADL's and discharge planning.The patient is planning to be discharged home with home health services ? ? ? ?

## 2021-07-31 NOTE — Anesthesia Preprocedure Evaluation (Addendum)
Anesthesia Evaluation  ?Patient identified by MRN, date of birth, ID band ?Patient awake ? ? ? ?Reviewed: ?Allergy & Precautions, NPO status , Patient's Chart, lab work & pertinent test results ? ?Airway ?Mallampati: II ? ?TM Distance: >3 FB ?Neck ROM: Full ? ? ? Dental ?no notable dental hx. ?(+) Dental Advisory Given, Edentulous Upper, Edentulous Lower,  ?  ?Pulmonary ?neg pulmonary ROS,  ?  ?Pulmonary exam normal ?breath sounds clear to auscultation ? ? ? ? ? ? Cardiovascular ?hypertension, Pt. on medications ?negative cardio ROS ?Normal cardiovascular exam ?Rhythm:Regular Rate:Normal ? ? ?  ?Neuro/Psych ? Headaches, negative neurological ROS ? negative psych ROS  ? GI/Hepatic ?negative GI ROS, Neg liver ROS,   ?Endo/Other  ?negative endocrine ROS ? Renal/GU ?negative Renal ROS  ?negative genitourinary ?  ?Musculoskeletal ?negative musculoskeletal ROS ?(+) Arthritis , Osteoarthritis,   ? Abdominal ?(+) + obese,   ?Peds ?negative pediatric ROS ?(+)  Hematology ?negative hematology ROS ?(+)   ?Anesthesia Other Findings ? ? Reproductive/Obstetrics ?negative OB ROS ? ?  ? ? ? ? ? ? ? ? ? ? ? ? ? ?  ?  ? ? ? ? ? ? ? ?Anesthesia Physical ? ?Anesthesia Plan ? ?ASA: 3 ? ?Anesthesia Plan: Spinal, Regional and MAC  ? ?Post-op Pain Management: Minimal or no pain anticipated  ? ?Induction: Intravenous ? ?PONV Risk Score and Plan: 1 and Treatment may vary due to age or medical condition and Propofol infusion ? ?Airway Management Planned: Nasal Cannula, Natural Airway and Simple Face Mask ? ?Additional Equipment: None ? ?Intra-op Plan:  ? ?Post-operative Plan:  ? ?Informed Consent: I have reviewed the patients History and Physical, chart, labs and discussed the procedure including the risks, benefits and alternatives for the proposed anesthesia with the patient or authorized representative who has indicated his/her understanding and acceptance.  ? ? ? ?Dental advisory given ? ?Plan Discussed  with: CRNA and Anesthesiologist ? ?Anesthesia Plan Comments: ( ? ?)  ? ? ? ? ? ? ?Anesthesia Quick Evaluation ? ?

## 2021-07-31 NOTE — Care Plan (Signed)
[facility] RNCM call to patient prior to his Right total hip arthroplasty with Dr. Ninfa Linden at [facility] on 08/01/21. He is an Ortho bundle patient through [facility] and is agreeable to case management. He lives with his spouse and has a son that lives very nearby that will be assisting after discharge. He has a RW and 3in1/BSC. Previous Left total hip done also by Dr. Ninfa Linden in 2014. Anticipate HHPT will be needed after a short hospital stay. Referral made to [facility] after choice provided. Reviewed all post op care instructions. Will continue to follow for needs.  ?

## 2021-08-01 ENCOUNTER — Ambulatory Visit (HOSPITAL_COMMUNITY): Payer: Medicare Other | Admitting: Physician Assistant

## 2021-08-01 ENCOUNTER — Ambulatory Visit (HOSPITAL_BASED_OUTPATIENT_CLINIC_OR_DEPARTMENT_OTHER): Payer: Medicare Other | Admitting: Certified Registered Nurse Anesthetist

## 2021-08-01 ENCOUNTER — Encounter (HOSPITAL_COMMUNITY): Payer: Self-pay | Admitting: Orthopaedic Surgery

## 2021-08-01 ENCOUNTER — Other Ambulatory Visit: Payer: Self-pay

## 2021-08-01 ENCOUNTER — Observation Stay (HOSPITAL_COMMUNITY)
Admission: RE | Admit: 2021-08-01 | Discharge: 2021-08-02 | Disposition: A | Discharge status: Home Health | Payer: Medicare Other | Attending: Orthopaedic Surgery | Admitting: Orthopaedic Surgery

## 2021-08-01 ENCOUNTER — Encounter (HOSPITAL_COMMUNITY)
Admission: RE | Disposition: A | Discharge status: Home Health | Payer: Self-pay | Source: Home / Self Care | Attending: Orthopaedic Surgery

## 2021-08-01 ENCOUNTER — Observation Stay (HOSPITAL_COMMUNITY): Payer: Medicare Other

## 2021-08-01 ENCOUNTER — Ambulatory Visit (HOSPITAL_COMMUNITY): Payer: Medicare Other

## 2021-08-01 DIAGNOSIS — Z6838 Body mass index (BMI) 38.0-38.9, adult: Secondary | ICD-10-CM | POA: Diagnosis not present

## 2021-08-01 DIAGNOSIS — Z7982 Long term (current) use of aspirin: Secondary | ICD-10-CM | POA: Diagnosis not present

## 2021-08-01 DIAGNOSIS — Z96642 Presence of left artificial hip joint: Secondary | ICD-10-CM | POA: Diagnosis not present

## 2021-08-01 DIAGNOSIS — Z96641 Presence of right artificial hip joint: Secondary | ICD-10-CM

## 2021-08-01 DIAGNOSIS — Z471 Aftercare following joint replacement surgery: Secondary | ICD-10-CM | POA: Diagnosis not present

## 2021-08-01 DIAGNOSIS — Z87891 Personal history of nicotine dependence: Secondary | ICD-10-CM

## 2021-08-01 DIAGNOSIS — M1611 Unilateral primary osteoarthritis, right hip: Secondary | ICD-10-CM

## 2021-08-01 DIAGNOSIS — I10 Essential (primary) hypertension: Secondary | ICD-10-CM | POA: Insufficient documentation

## 2021-08-01 DIAGNOSIS — Z79899 Other long term (current) drug therapy: Secondary | ICD-10-CM | POA: Insufficient documentation

## 2021-08-01 DIAGNOSIS — E669 Obesity, unspecified: Secondary | ICD-10-CM | POA: Diagnosis not present

## 2021-08-01 DIAGNOSIS — Z01818 Encounter for other preprocedural examination: Secondary | ICD-10-CM

## 2021-08-01 HISTORY — PX: TOTAL HIP ARTHROPLASTY: SHX124

## 2021-08-01 LAB — BASIC METABOLIC PANEL
Anion gap: 8 (ref 5–15)
BUN: 14 mg/dL (ref 8–23)
CO2: 19 mmol/L — ABNORMAL LOW (ref 22–32)
Calcium: 7.5 mg/dL — ABNORMAL LOW (ref 8.9–10.3)
Chloride: 111 mmol/L (ref 98–111)
Creatinine, Ser: 0.63 mg/dL (ref 0.61–1.24)
GFR, Estimated: >60 mL/min (ref >60)
Glucose, Bld: 76 mg/dL (ref 70–99)
Potassium: 4.3 mmol/L (ref 3.5–5.1)
Sodium: 138 mmol/L (ref 135–145)

## 2021-08-01 LAB — CBC
HCT: 35.7 % — ABNORMAL LOW (ref 39.0–52.0)
Hemoglobin: 12 g/dL — ABNORMAL LOW (ref 13.0–17.0)
MCH: 33.2 pg (ref 26.0–34.0)
MCHC: 33.6 g/dL (ref 30.0–36.0)
MCV: 98.9 fL (ref 80.0–100.0)
Platelets: 149 10*3/uL — ABNORMAL LOW (ref 150–400)
RBC: 3.61 MIL/uL — ABNORMAL LOW (ref 4.22–5.81)
RDW: 12.8 % (ref 11.5–15.5)
WBC: 4.9 10*3/uL (ref 4.0–10.5)
nRBC: 0 % (ref 0.0–0.2)

## 2021-08-01 SURGERY — ARTHROPLASTY, HIP, TOTAL, ANTERIOR APPROACH
Anesthesia: Monitor Anesthesia Care | Site: Hip | Laterality: Right

## 2021-08-01 MED ORDER — CEFAZOLIN SODIUM-DEXTROSE 1-4 GM/50ML-% IV SOLN
1.0000 g | Freq: Four times a day (QID) | INTRAVENOUS | Status: AC
  Administered 2021-08-01 (×2): 1 g via INTRAVENOUS
  Filled 2021-08-01 (×2): qty 50

## 2021-08-01 MED ORDER — ACETAMINOPHEN 325 MG PO TABS: 325.0000 mg | ORAL_TABLET | Freq: Four times a day (QID) | ORAL | Status: DC | PRN

## 2021-08-01 MED ORDER — METHOCARBAMOL 500 MG PO TABS
500.0000 mg | ORAL_TABLET | Freq: Four times a day (QID) | ORAL | Status: DC | PRN
  Administered 2021-08-01: 500 mg via ORAL
  Filled 2021-08-01: qty 1

## 2021-08-01 MED ORDER — MENTHOL 3 MG MT LOZG: 1.0000 | LOZENGE | OROMUCOSAL | Status: DC | PRN

## 2021-08-01 MED ORDER — LACTATED RINGERS IV SOLN
INTRAVENOUS | Status: DC
Start: 2021-08-01 — End: 2021-08-01

## 2021-08-01 MED ORDER — PROPOFOL 10 MG/ML IV BOLUS
INTRAVENOUS | Status: DC | PRN
  Administered 2021-08-01: 30 mg via INTRAVENOUS

## 2021-08-01 MED ORDER — ROSUVASTATIN CALCIUM 20 MG PO TABS
20.0000 mg | ORAL_TABLET | ORAL | Status: DC
  Administered 2021-08-02: 20 mg via ORAL
  Filled 2021-08-01: qty 1

## 2021-08-01 MED ORDER — FINASTERIDE 5 MG PO TABS
5.0000 mg | ORAL_TABLET | Freq: Every day | ORAL | Status: DC
  Administered 2021-08-01 – 2021-08-02 (×2): 5 mg via ORAL
  Filled 2021-08-01 (×2): qty 1

## 2021-08-01 MED ORDER — PROPOFOL 1000 MG/100ML IV EMUL
INTRAVENOUS | Status: AC
  Filled 2021-08-01: qty 100

## 2021-08-01 MED ORDER — PROPOFOL 500 MG/50ML IV EMUL
INTRAVENOUS | Status: DC | PRN
  Administered 2021-08-01: 75 ug/kg/min via INTRAVENOUS

## 2021-08-01 MED ORDER — OXYCODONE HCL 5 MG/5ML PO SOLN: 5.0000 mg | Freq: Once | ORAL | Status: DC | PRN

## 2021-08-01 MED ORDER — POVIDONE-IODINE 10 % EX SWAB
2.0000 "application " | Freq: Once | CUTANEOUS | Status: AC
  Administered 2021-08-01: 2 via TOPICAL

## 2021-08-01 MED ORDER — CEFAZOLIN SODIUM-DEXTROSE 2-4 GM/100ML-% IV SOLN
2.0000 g | INTRAVENOUS | Status: AC
  Administered 2021-08-01: 2 g via INTRAVENOUS
  Filled 2021-08-01: qty 100

## 2021-08-01 MED ORDER — FENTANYL CITRATE PF 50 MCG/ML IJ SOSY: 25.0000 ug | PREFILLED_SYRINGE | INTRAMUSCULAR | Status: DC | PRN

## 2021-08-01 MED ORDER — HYDROMORPHONE HCL 2 MG PO TABS: 2.0000 mg | ORAL_TABLET | ORAL | Status: DC | PRN

## 2021-08-01 MED ORDER — METHOCARBAMOL 500 MG IVPB - SIMPLE MED
500.0000 mg | Freq: Four times a day (QID) | INTRAVENOUS | Status: DC | PRN
  Filled 2021-08-01: qty 50

## 2021-08-01 MED ORDER — HYDROMORPHONE HCL 2 MG PO TABS: 1.0000 mg | ORAL_TABLET | ORAL | Status: DC | PRN

## 2021-08-01 MED ORDER — PHENYLEPHRINE HCL-NACL 20-0.9 MG/250ML-% IV SOLN
INTRAVENOUS | Status: AC
  Filled 2021-08-01: qty 500

## 2021-08-01 MED ORDER — AMLODIPINE BESYLATE 10 MG PO TABS
10.0000 mg | ORAL_TABLET | Freq: Every day | ORAL | Status: DC
  Administered 2021-08-02: 10 mg via ORAL
  Filled 2021-08-01: qty 1

## 2021-08-01 MED ORDER — SODIUM CHLORIDE 0.9 % IV SOLN: INTRAVENOUS | Status: DC

## 2021-08-01 MED ORDER — 0.9 % SODIUM CHLORIDE (POUR BTL) OPTIME
TOPICAL | Status: DC | PRN
  Administered 2021-08-01: 1000 mL

## 2021-08-01 MED ORDER — OXYCODONE HCL 5 MG PO TABS
10.0000 mg | ORAL_TABLET | ORAL | Status: DC | PRN
  Administered 2021-08-01 – 2021-08-02 (×4): 15 mg via ORAL
  Filled 2021-08-01 (×4): qty 3
  Filled 2021-08-01: qty 2

## 2021-08-01 MED ORDER — DEXAMETHASONE SODIUM PHOSPHATE 10 MG/ML IJ SOLN
INTRAMUSCULAR | Status: DC | PRN
Start: 2021-08-01 — End: 2021-08-01
  Administered 2021-08-01: 5 mg via INTRAVENOUS

## 2021-08-01 MED ORDER — ORAL CARE MOUTH RINSE: 15.0000 mL | Freq: Once | OROMUCOSAL | Status: AC

## 2021-08-01 MED ORDER — OXYCODONE HCL 5 MG PO TABS
5.0000 mg | ORAL_TABLET | ORAL | Status: DC | PRN
  Administered 2021-08-01: 5 mg via ORAL
  Administered 2021-08-02: 10 mg via ORAL
  Filled 2021-08-01: qty 1

## 2021-08-01 MED ORDER — OXYCODONE HCL 5 MG PO TABS: 5.0000 mg | ORAL_TABLET | Freq: Once | ORAL | Status: DC | PRN

## 2021-08-01 MED ORDER — MEPERIDINE HCL 50 MG/ML IJ SOLN: 6.2500 mg | INTRAMUSCULAR | Status: DC | PRN

## 2021-08-01 MED ORDER — ONDANSETRON HCL 4 MG PO TABS: 4.0000 mg | ORAL_TABLET | Freq: Four times a day (QID) | ORAL | Status: DC | PRN

## 2021-08-01 MED ORDER — CHLORHEXIDINE GLUCONATE 0.12 % MT SOLN: 15.0000 mL | Freq: Once | OROMUCOSAL | Status: DC

## 2021-08-01 MED ORDER — DEXAMETHASONE SODIUM PHOSPHATE 10 MG/ML IJ SOLN
INTRAMUSCULAR | Status: AC
  Filled 2021-08-01: qty 1

## 2021-08-01 MED ORDER — MIDAZOLAM HCL 2 MG/2ML IJ SOLN
INTRAMUSCULAR | Status: AC
  Filled 2021-08-01: qty 2

## 2021-08-01 MED ORDER — HYDROMORPHONE HCL 1 MG/ML IJ SOLN
0.5000 mg | INTRAMUSCULAR | Status: DC | PRN
  Administered 2021-08-01: 1 mg via INTRAVENOUS
  Filled 2021-08-01: qty 1

## 2021-08-01 MED ORDER — SODIUM CHLORIDE 0.9 % IR SOLN
Status: DC | PRN
  Administered 2021-08-01: 3000 mL

## 2021-08-01 MED ORDER — ONDANSETRON HCL 4 MG/2ML IJ SOLN: 4.0000 mg | Freq: Once | INTRAMUSCULAR | Status: DC | PRN

## 2021-08-01 MED ORDER — CHLORHEXIDINE GLUCONATE 0.12 % MT SOLN
15.0000 mL | Freq: Once | OROMUCOSAL | Status: AC
  Administered 2021-08-01: 15 mL via OROMUCOSAL

## 2021-08-01 MED ORDER — ONDANSETRON HCL 4 MG/2ML IJ SOLN: 4.0000 mg | Freq: Four times a day (QID) | INTRAMUSCULAR | Status: DC | PRN

## 2021-08-01 MED ORDER — ACETAMINOPHEN 160 MG/5ML PO SOLN: 325.0000 mg | ORAL | Status: DC | PRN

## 2021-08-01 MED ORDER — METOCLOPRAMIDE HCL 5 MG/ML IJ SOLN: 5.0000 mg | Freq: Three times a day (TID) | INTRAMUSCULAR | Status: DC | PRN

## 2021-08-01 MED ORDER — TRANEXAMIC ACID-NACL 1000-0.7 MG/100ML-% IV SOLN
1000.0000 mg | INTRAVENOUS | Status: AC
  Administered 2021-08-01: 1000 mg via INTRAVENOUS
  Filled 2021-08-01: qty 100

## 2021-08-01 MED ORDER — ORAL CARE MOUTH RINSE: 15.0000 mL | Freq: Once | OROMUCOSAL | Status: DC

## 2021-08-01 MED ORDER — DIPHENHYDRAMINE HCL 12.5 MG/5ML PO ELIX: 12.5000 mg | ORAL_SOLUTION | ORAL | Status: DC | PRN

## 2021-08-01 MED ORDER — PHENOL 1.4 % MT LIQD: 1.0000 | OROMUCOSAL | Status: DC | PRN

## 2021-08-01 MED ORDER — METOPROLOL SUCCINATE ER 25 MG PO TB24
25.0000 mg | ORAL_TABLET | Freq: Every day | ORAL | Status: DC
  Administered 2021-08-02: 25 mg via ORAL
  Filled 2021-08-01: qty 1

## 2021-08-01 MED ORDER — PANTOPRAZOLE SODIUM 40 MG PO TBEC
40.0000 mg | DELAYED_RELEASE_TABLET | Freq: Every day | ORAL | Status: DC
  Administered 2021-08-01 – 2021-08-02 (×2): 40 mg via ORAL
  Filled 2021-08-01 (×2): qty 1

## 2021-08-01 MED ORDER — PHENYLEPHRINE HCL-NACL 20-0.9 MG/250ML-% IV SOLN
INTRAVENOUS | Status: DC | PRN
  Administered 2021-08-01: 40 ug/min via INTRAVENOUS

## 2021-08-01 MED ORDER — DOCUSATE SODIUM 100 MG PO CAPS
100.0000 mg | ORAL_CAPSULE | Freq: Two times a day (BID) | ORAL | Status: DC
  Administered 2021-08-01 – 2021-08-02 (×2): 100 mg via ORAL
  Filled 2021-08-01 (×2): qty 1

## 2021-08-01 MED ORDER — METOCLOPRAMIDE HCL 5 MG PO TABS: 5.0000 mg | ORAL_TABLET | Freq: Three times a day (TID) | ORAL | Status: DC | PRN

## 2021-08-01 MED ORDER — EZETIMIBE 10 MG PO TABS
10.0000 mg | ORAL_TABLET | Freq: Every day | ORAL | Status: DC
Start: 2021-08-01 — End: 2021-08-02
  Administered 2021-08-01 – 2021-08-02 (×2): 10 mg via ORAL
  Filled 2021-08-01 (×2): qty 1

## 2021-08-01 MED ORDER — LACTATED RINGERS IV SOLN: INTRAVENOUS | Status: DC

## 2021-08-01 MED ORDER — ALUM & MAG HYDROXIDE-SIMETH 200-200-20 MG/5ML PO SUSP: 30.0000 mL | ORAL | Status: DC | PRN

## 2021-08-01 MED ORDER — ASPIRIN 81 MG PO CHEW
81.0000 mg | CHEWABLE_TABLET | Freq: Two times a day (BID) | ORAL | Status: DC
  Administered 2021-08-01 – 2021-08-02 (×2): 81 mg via ORAL
  Filled 2021-08-01 (×2): qty 1

## 2021-08-01 MED ORDER — MIDAZOLAM HCL 5 MG/5ML IJ SOLN
INTRAMUSCULAR | Status: DC | PRN
  Administered 2021-08-01: 2 mg via INTRAVENOUS

## 2021-08-01 MED ORDER — ACETAMINOPHEN 325 MG PO TABS: 325.0000 mg | ORAL_TABLET | ORAL | Status: DC | PRN

## 2021-08-01 SURGICAL SUPPLY — 45 items
ACETAB CUP W/GRIPTION 54 (Plate) ×2 IMPLANT
BAG COUNTER SPONGE SURGICOUNT (BAG) ×2 IMPLANT
BAG ZIPLOCK 12X15 (MISCELLANEOUS) IMPLANT
BENZOIN TINCTURE PRP APPL 2/3 (GAUZE/BANDAGES/DRESSINGS) IMPLANT
BLADE SAW SGTL 18X1.27X75 (BLADE) ×2 IMPLANT
COVER PERINEAL POST (MISCELLANEOUS) ×2 IMPLANT
COVER SURGICAL LIGHT HANDLE (MISCELLANEOUS) ×2 IMPLANT
CUP ACETAB W/GRIPTION 54 (Plate) IMPLANT
DRAPE FOOT SWITCH (DRAPES) ×2 IMPLANT
DRAPE STERI IOBAN 125X83 (DRAPES) ×2 IMPLANT
DRAPE U-SHAPE 47X51 STRL (DRAPES) ×4 IMPLANT
DRESSING AQUACEL AG SP 3.5X10 (GAUZE/BANDAGES/DRESSINGS) IMPLANT
DRSG AQUACEL AG ADV 3.5X10 (GAUZE/BANDAGES/DRESSINGS) ×2 IMPLANT
DRSG AQUACEL AG SP 3.5X10 (GAUZE/BANDAGES/DRESSINGS) ×2
DURAPREP 26ML APPLICATOR (WOUND CARE) ×2 IMPLANT
ELECT REM PT RETURN 15FT ADLT (MISCELLANEOUS) ×2 IMPLANT
GAUZE XEROFORM 1X8 LF (GAUZE/BANDAGES/DRESSINGS) ×2 IMPLANT
GLOVE BIO SURGEON STRL SZ7.5 (GLOVE) ×2 IMPLANT
GLOVE BIOGEL PI IND STRL 8 (GLOVE) ×2 IMPLANT
GLOVE BIOGEL PI INDICATOR 8 (GLOVE) ×2
GLOVE SKINSENSE NS SZ8.0 LF (GLOVE) ×1
GLOVE SKINSENSE STRL SZ8.0 LF (GLOVE) ×1 IMPLANT
GOWN STRL REUS W/ TWL XL LVL3 (GOWN DISPOSABLE) ×2 IMPLANT
GOWN STRL REUS W/TWL XL LVL3 (GOWN DISPOSABLE) ×2
HANDPIECE INTERPULSE COAX TIP (DISPOSABLE) ×1
HEAD M SROM 36MM PLUS 1.5 (Hips) IMPLANT
HOLDER FOLEY CATH W/STRAP (MISCELLANEOUS) ×2 IMPLANT
KIT TURNOVER KIT A (KITS) IMPLANT
LINER NEUTRAL 54X36MM PLUS 4 (Hips) ×1 IMPLANT
PACK ANTERIOR HIP CUSTOM (KITS) ×2 IMPLANT
SET HNDPC FAN SPRY TIP SCT (DISPOSABLE) ×1 IMPLANT
SPONGE T-LAP 18X18 ~~LOC~~+RFID (SPONGE) ×6 IMPLANT
SROM M HEAD 36MM PLUS 1.5 (Hips) ×2 IMPLANT
STAPLER VISISTAT 35W (STAPLE) IMPLANT
STEM CORAIL KA14 (Stem) ×1 IMPLANT
STRIP CLOSURE SKIN 1/2X4 (GAUZE/BANDAGES/DRESSINGS) IMPLANT
SUT ETHIBOND NAB CT1 #1 30IN (SUTURE) ×2 IMPLANT
SUT ETHILON 2 0 PS N (SUTURE) IMPLANT
SUT MNCRL AB 4-0 PS2 18 (SUTURE) IMPLANT
SUT VIC AB 0 CT1 36 (SUTURE) ×2 IMPLANT
SUT VIC AB 1 CT1 36 (SUTURE) ×2 IMPLANT
SUT VIC AB 2-0 CT1 27 (SUTURE) ×2
SUT VIC AB 2-0 CT1 TAPERPNT 27 (SUTURE) ×2 IMPLANT
TRAY FOLEY MTR SLVR 16FR STAT (SET/KITS/TRAYS/PACK) IMPLANT
YANKAUER SUCT BULB TIP NO VENT (SUCTIONS) ×2 IMPLANT

## 2021-08-01 NOTE — Interval H&P Note (Signed)
History and Physical Interval Note: The patient understands that he is here today for a right hip replacement to treat his right hip osteoarthritis.  There has been no acute or interval change in his medical status.  Please see H&P.  The risks and benefits of surgery been explained in detail and informed consent is obtained.  The right operative hip has been marked. ? ?08/01/2021 ?6:59 AM ? ?Hasten Sweitzer  has presented today for surgery, with the diagnosis of osteoarthritis right hip.  The various methods of treatment have been discussed with the patient and family. After consideration of risks, benefits and other options for treatment, the patient has consented to  Procedure(s): ?RIGHT TOTAL HIP ARTHROPLASTY ANTERIOR APPROACH (Right) as a surgical intervention.  The patient's history has been reviewed, patient examined, no change in status, stable for surgery.  I have reviewed the patient's chart and labs.  Questions were answered to the patient's satisfaction.   ? ? ?Mcarthur Rossetti ? ? ?

## 2021-08-01 NOTE — Care Plan (Signed)
Ortho Bundle Case Management Note ? ?Patient Details  ?Name: Lee Torres ?MRN: [id number] ?Date of Birth: [birth year] ? ?[facility] RNCM call to patient prior to his Right total hip arthroplasty with Dr. Ninfa Linden at [facility] on 08/01/21. He is an Ortho bundle patient through [facility] and is agreeable to case management. He lives with his spouse and has a son that lives very nearby that will be assisting after discharge. He has a RW and 3in1/BSC. Previous Left total hip done also by Dr. Ninfa Linden in 2014. Anticipate HHPT will be needed after a short hospital stay. Referral made to [facility] after choice provided. Reviewed all post op care instructions. Will continue to follow for needs.                 ? ? ? ?DME Arranged:   (Patient reports having all DME needed (RW and 3in1/BSC)) ?DME Agency:    ? ?HH Arranged:  PT ?[place] Agency:  [place] ? ?Additional Comments: ?Please contact me with any questions of if this plan should need to change. ? ?Jamse Arn, RN, BSN, [employer]  [phone] ?08/01/2021, 5:44 PM ?  ?

## 2021-08-01 NOTE — Anesthesia Procedure Notes (Signed)
Procedure Name: Orick ?Date/Time: 08/01/2021 7:35 AM ?Performed by: Claudia Desanctis, CRNA ?Pre-anesthesia Checklist: Patient identified, Emergency Drugs available, Suction available and Patient being monitored ?Patient Re-evaluated:Patient Re-evaluated prior to induction ?Oxygen Delivery Method: Simple face mask ? ? ? ? ?

## 2021-08-01 NOTE — Anesthesia Postprocedure Evaluation (Signed)
Anesthesia Post Note ? ?Patient: Lee Torres ? ?Procedure(s) Performed: RIGHT TOTAL HIP ARTHROPLASTY ANTERIOR APPROACH (Right: Hip) ? ?  ? ?Patient location during evaluation: PACU ?Anesthesia Type: Regional and Spinal ?Level of consciousness: oriented and awake and alert ?Pain management: pain level controlled ?Vital Signs Assessment: post-procedure vital signs reviewed and stable ?Respiratory status: spontaneous breathing, respiratory function stable and patient connected to nasal cannula oxygen ?Cardiovascular status: blood pressure returned to baseline and stable ?Postop Assessment: no headache, no backache and no apparent nausea or vomiting ?Anesthetic complications: no ? ? ?No notable events documented. ? ?Last Vitals:  ?Vitals:  ? 08/01/21 1034 08/01/21 1344  ?BP: (!) 153/91 (!) 142/93  ?Pulse: (!) 58 62  ?Resp: 17 18  ?Temp: 36.6 ?C 36.7 ?C  ?SpO2: 98% 95%  ?  ?Last Pain:  ?Vitals:  ? 08/01/21 1344  ?TempSrc: Oral  ?PainSc:   ? ? ?  ?  ?  ?  ?  ?  ? ?Lee Torres ? ? ? ? ?

## 2021-08-01 NOTE — Plan of Care (Signed)
?  Problem: Safety: ?Goal: Ability to remain free from injury will improve ?Outcome: Progressing ?  ?Problem: Education: ?Goal: Knowledge of the prescribed therapeutic regimen will improve ?Outcome: Progressing ?  ?Problem: Activity: ?Goal: Ability to avoid complications of mobility impairment will improve ?Outcome: Progressing ?  ?Problem: Pain Management: ?Goal: Pain level will decrease with appropriate interventions ?Outcome: Progressing ?  ?

## 2021-08-01 NOTE — Transfer of Care (Signed)
Immediate Anesthesia Transfer of Care Note ? ?Patient: Lee Torres ? ?Procedure(s) Performed: RIGHT TOTAL HIP ARTHROPLASTY ANTERIOR APPROACH (Right: Hip) ? ?Patient Location: PACU ? ?Anesthesia Type:Spinal ? ?Level of Consciousness: drowsy ? ?Airway & Oxygen Therapy: Patient Spontanous Breathing and Patient connected to face mask ? ?Post-op Assessment: Report given to RN and Post -op Vital signs reviewed and stable ? ?Post vital signs: Reviewed and stable ? ?Last Vitals:  ?Vitals Value Taken Time  ?BP 96/51 08/01/21 0847  ?Temp    ?Pulse 54 08/01/21 0849  ?Resp 13 08/01/21 0849  ?SpO2 99 % 08/01/21 0849  ?Vitals shown include unvalidated device data. ? ?Last Pain:  ?Vitals:  ? 08/01/21 0548  ?TempSrc: Oral  ?   ? ?  ? ?Complications: No notable events documented. ?

## 2021-08-01 NOTE — Brief Op Note (Signed)
08/01/2021 ? ?8:27 AM ? ?PATIENT:  Lee Torres  76 y.o. male ? ?PRE-OPERATIVE DIAGNOSIS:  osteoarthritis right hip ? ?POST-OPERATIVE DIAGNOSIS:  osteoarthritis right hip ? ?PROCEDURE:  Procedure(s): ?RIGHT TOTAL HIP ARTHROPLASTY ANTERIOR APPROACH (Right) ? ?SURGEON:  Surgeon(s) and Role: ?   Mcarthur Rossetti, MD - Primary ? ?PHYSICIAN ASSISTANT:  Benita Stabile, PA-C ? ?ANESTHESIA:   spinal ? ?EBL:  200 mL  ? ?COUNTS:  YES ? ?DICTATION: .Other Dictation: Dictation Number [id number] ? ?PLAN OF CARE: Admit for overnight observation ? ?PATIENT DISPOSITION:  PACU - hemodynamically stable. ?  ?Delay start of Pharmacological VTE agent (>24hrs) due to surgical blood loss or risk of bleeding: no ? ?

## 2021-08-01 NOTE — Evaluation (Signed)
Physical Therapy Evaluation ?Patient Details ?Name: Lee Torres ?MRN: [id number] ?DOB: [birth year] ?Today's Date: 08/01/2021 ? ?History of Present Illness ? Pt s/p R THR and with hx of L THR and legally blind in R eye  ?Clinical Impression ? Pt s/p R THR and presents with decreased R LE strength/ROM and post op pain limiting functional mobility. Pt should progress to dc home with family assist. ?   ? ?Recommendations for follow up therapy are one component of a multi-disciplinary discharge planning process, led by the attending physician.  Recommendations may be updated based on patient status, additional functional criteria and insurance authorization. ? ?Follow Up Recommendations Follow physician's recommendations for discharge plan and follow up therapies ? ?  ?Assistance Recommended at Discharge Frequent or constant Supervision/Assistance  ?Patient can return home with the following ? A little help with walking and/or transfers;A little help with bathing/dressing/bathroom;Assistance with cooking/housework;Help with stairs or ramp for entrance;Assist for transportation ? ?  ?Equipment Recommendations None recommended by PT  ?Recommendations for Other Services ?    ?  ?Functional Status Assessment Patient has had a recent decline in their functional status and demonstrates the ability to make significant improvements in function in a reasonable and predictable amount of time.  ? ?  ?Precautions / Restrictions Precautions ?Precautions: Fall ?Restrictions ?Weight Bearing Restrictions: No ?Other Position/Activity Restrictions: WBAT  ? ?  ? ?Mobility ? Bed Mobility ?Overal bed mobility: Needs Assistance ?Bed Mobility: Supine to Sit ?  ?  ?Supine to sit: Min assist ?  ?  ?General bed mobility comments: Increased time with cues for sequence and use of L LE to self assist;  Extensive use of bed rail and physical assist to manage R LE and to control trunk.  Pt states sleeps in recliner at home ?  ? ?Transfers ?Overall  transfer level: Needs assistance ?Equipment used: Rolling walker (2 wheels) ?Transfers: Sit to/from Stand ?Sit to Stand: Min assist, From elevated surface ?  ?  ?  ?  ?  ?General transfer comment: cues for LE management and use of UEs to self assist ?  ? ?Ambulation/Gait ?Ambulation/Gait assistance: Min assist ?Gait Distance (Feet): 50 Feet ?Assistive device: Rolling walker (2 wheels) ?Gait Pattern/deviations: Step-to pattern, Step-through pattern, Decreased step length - right, Decreased step length - left, Shuffle, Trunk flexed ?Gait velocity: decr ?  ?  ?General Gait Details: cues for sequence, posture and position from RW ? ?Stairs ?  ?  ?  ?  ?  ? ?Wheelchair Mobility ?  ? ?Modified Rankin (Stroke Patients Only) ?  ? ?  ? ?Balance Overall balance assessment: Needs assistance ?Sitting-balance support: Feet supported, No upper extremity supported ?Sitting balance-Leahy Scale: Good ?  ?  ?Standing balance support: Bilateral upper extremity supported ?Standing balance-Leahy Scale: Poor ?  ?  ?  ?  ?  ?  ?  ?  ?  ?  ?  ?  ?   ? ? ? ?Pertinent Vitals/Pain Pain Assessment ?Pain Assessment: 0-10 ?Pain Score: 8  ?Pain Location: R hip ?Pain Descriptors / Indicators: Aching, Sore ?Pain Intervention(s): Monitored during session, Premedicated before session, Limited activity within patient's tolerance  ? ? ?Home Living Family/patient expects to be discharged to:: Private residence ?Living Arrangements: Spouse/significant other ?Available Help at Discharge: Family;Available 24 hours/day ?Type of Home: House ?Home Access: Stairs to enter ?Entrance Stairs-Rails: Can reach both (pt states one rail is in poor condition) ?Entrance Stairs-Number of Steps: 4 ?  ?Home Layout: One level ?Home Equipment: Rolling  Walker (2 wheels);Cane - single point;Tub bench;Rollator (4 wheels);Crutches ?   ?  ?Prior Function Prior Level of Function : Independent/Modified Independent ?  ?  ?  ?  ?  ?  ?  ?  ?  ? ? ?Hand Dominance  ? Dominant Hand:  Right ? ?  ?Extremity/Trunk Assessment  ? Upper Extremity Assessment ?Upper Extremity Assessment: Overall WFL for tasks assessed ?  ? ?Lower Extremity Assessment ?Lower Extremity Assessment: RLE deficits/detail ?  ? ?Cervical / Trunk Assessment ?Cervical / Trunk Assessment: Normal  ?Communication  ? Communication: No difficulties  ?Cognition Arousal/Alertness: Awake/alert ?Behavior During Therapy: [facility] for tasks assessed/performed ?Overall Cognitive Status: Within Functional Limits for tasks assessed ?  ?  ?  ?  ?  ?  ?  ?  ?  ?  ?  ?  ?  ?  ?  ?  ?  ?  ?  ? ?  ?General Comments   ? ?  ?Exercises Total Joint Exercises ?Ankle Circles/Pumps: AROM, Both, 15 reps, Supine  ? ?Assessment/Plan  ?  ?PT Assessment Patient needs continued PT services  ?PT Problem List Decreased strength;Decreased range of motion;Decreased activity tolerance;Decreased balance;Decreased mobility;Decreased knowledge of use of DME;Obesity;Pain ? ?   ?  ?PT Treatment Interventions DME instruction;Gait training;Stair training;Functional mobility training;Therapeutic activities;Therapeutic exercise;Balance training;Patient/family education   ? ?PT Goals (Current goals can be found in the Care Plan section)  ?Acute Rehab PT Goals ?Patient Stated Goal: Regain IND ?PT Goal Formulation: With patient ?Time For Goal Achievement: 08/08/21 ?Potential to Achieve Goals: Good ? ?  ?Frequency 7X/week ?  ? ? ?Co-evaluation   ?  ?  ?  ?  ? ? ?  ?AM-PAC PT "6 Clicks" Mobility  ?Outcome Measure Help needed turning from your back to your side while in a flat bed without using bedrails?: A Lot ?Help needed moving from lying on your back to sitting on the side of a flat bed without using bedrails?: A Lot ?Help needed moving to and from a bed to a chair (including a wheelchair)?: A Lot ?Help needed standing up from a chair using your arms (e.g., wheelchair or bedside chair)?: A Little ?Help needed to walk in hospital room?: A Little ?Help needed climbing 3-5 steps with  a railing? : A Lot ?6 Click Score: 14 ? ?  ?End of Session Equipment Utilized During Treatment: Gait belt ?Activity Tolerance: Patient tolerated treatment well ?Patient left: in chair;with call bell/phone within reach;with chair alarm set;with family/visitor present ?Nurse Communication: Mobility status ?PT Visit Diagnosis: Difficulty in walking, not elsewhere classified (R26.2) ?  ? ?Time: 4098-1191 ?PT Time Calculation (min) (ACUTE ONLY): 41 min ? ? ?Charges:   PT Evaluation ?$PT Eval Low Complexity: 1 Low ?PT Treatments ?$Gait Training: 8-22 mins ?  ?   ? ? ?Mauro Kaufmann PT ?Acute Rehabilitation Services ?Pager [phone] ?Office [phone] ? ? ?Joby Richart ?08/01/2021, 3:31 PM ? ?

## 2021-08-02 ENCOUNTER — Encounter (HOSPITAL_COMMUNITY): Payer: Self-pay | Admitting: Orthopaedic Surgery

## 2021-08-02 DIAGNOSIS — Z96642 Presence of left artificial hip joint: Secondary | ICD-10-CM | POA: Diagnosis not present

## 2021-08-02 DIAGNOSIS — Z7982 Long term (current) use of aspirin: Secondary | ICD-10-CM | POA: Diagnosis not present

## 2021-08-02 DIAGNOSIS — M1611 Unilateral primary osteoarthritis, right hip: Secondary | ICD-10-CM | POA: Diagnosis not present

## 2021-08-02 DIAGNOSIS — I10 Essential (primary) hypertension: Secondary | ICD-10-CM | POA: Diagnosis not present

## 2021-08-02 DIAGNOSIS — Z79899 Other long term (current) drug therapy: Secondary | ICD-10-CM | POA: Diagnosis not present

## 2021-08-02 LAB — CBC
HCT: 36 % — ABNORMAL LOW (ref 39.0–52.0)
Hemoglobin: 12.2 g/dL — ABNORMAL LOW (ref 13.0–17.0)
MCH: 33.1 pg (ref 26.0–34.0)
MCHC: 33.9 g/dL (ref 30.0–36.0)
MCV: 97.6 fL (ref 80.0–100.0)
Platelets: 167 10*3/uL (ref 150–400)
RBC: 3.69 MIL/uL — ABNORMAL LOW (ref 4.22–5.81)
RDW: 13 % (ref 11.5–15.5)
WBC: 10.2 10*3/uL (ref 4.0–10.5)
nRBC: 0 % (ref 0.0–0.2)

## 2021-08-02 LAB — BASIC METABOLIC PANEL
Anion gap: 6 (ref 5–15)
BUN: 18 mg/dL (ref 8–23)
CO2: 24 mmol/L (ref 22–32)
Calcium: 8.6 mg/dL — ABNORMAL LOW (ref 8.9–10.3)
Chloride: 107 mmol/L (ref 98–111)
Creatinine, Ser: 0.87 mg/dL (ref 0.61–1.24)
GFR, Estimated: >60 mL/min (ref >60)
Glucose, Bld: 150 mg/dL — ABNORMAL HIGH (ref 70–99)
Potassium: 4.6 mmol/L (ref 3.5–5.1)
Sodium: 137 mmol/L (ref 135–145)

## 2021-08-02 MED ORDER — ASPIRIN 81 MG PO CHEW
81.0000 mg | CHEWABLE_TABLET | Freq: Two times a day (BID) | ORAL | 0 refills | Status: AC
Start: 2021-08-02 — End: ?

## 2021-08-02 MED ORDER — OXYCODONE HCL 5 MG PO TABS: 5.0000 mg | ORAL_TABLET | Freq: Four times a day (QID) | ORAL | 0 refills | Status: DC | PRN

## 2021-08-02 MED ORDER — METHOCARBAMOL 500 MG PO TABS: 500.0000 mg | ORAL_TABLET | Freq: Four times a day (QID) | ORAL | 1 refills | Status: DC | PRN

## 2021-08-02 NOTE — Progress Notes (Signed)
Patient discharged to home w/ family. Given all belongings, instructions. Verbalized understanding of instructions. Escorted to pov via w/c. 

## 2021-08-02 NOTE — Discharge Summary (Signed)
?Patient ID: ?Lee Torres ?MRN: [id number] ?DOB/AGE: 1946-04-08 76 y.o. ? ?Admit date: 08/01/2021 ?Discharge date: 08/02/2021 ? ?Admission Diagnoses:  ?Principal Problem: ?  Unilateral primary osteoarthritis, right hip ?Active Problems: ?  Status post total replacement of right hip ? ? ?Discharge Diagnoses:  ?Same ? ?Past Medical History:  ?Diagnosis Date  ? Arthritis   ? oa  ? Elevated cholesterol   ? Headache   ? Hypertension   ? Inguinal hernia   ? left side, repaired unsuccessfully 2 yrs ago  ? Legally blind in right eye, as defined in [place]   ? ? ?Surgeries: Procedure(s): ?RIGHT TOTAL HIP ARTHROPLASTY ANTERIOR APPROACH on 08/01/2021 ?  ?Consultants:  ? ?Discharged Condition: Improved ? ?Hospital Course: Lee Torres is an 76 y.o. male who was admitted 08/01/2021 for operative treatment ofUnilateral primary osteoarthritis, right hip. Patient has severe unremitting pain that affects sleep, daily activities, and work/hobbies. After pre-op clearance the patient was taken to the operating room on 08/01/2021 and underwent  Procedure(s): ?RIGHT TOTAL HIP ARTHROPLASTY ANTERIOR APPROACH.   ? ?Patient was given perioperative antibiotics:  ?Anti-infectives (From admission, onward)  ? ? Start     Dose/Rate Route Frequency Ordered Stop  ? 08/01/21 1315  ceFAZolin (ANCEF) IVPB 1 g/50 mL premix       ? 1 g ?100 mL/hr over 30 Minutes Intravenous Every 6 hours 08/01/21 1029 08/01/21 2033  ? 08/01/21 0600  ceFAZolin (ANCEF) IVPB 2g/100 mL premix       ? 2 g ?200 mL/hr over 30 Minutes Intravenous On call to O.R. 08/01/21 0515 08/01/21 0723  ? ?  ?  ? ?Patient was given sequential compression devices, early ambulation, and chemoprophylaxis to prevent DVT. ? ?Patient benefited maximally from hospital stay and there were no complications.   ? ?Recent vital signs: Patient Vitals for the past 24 hrs: ? BP Temp Temp src Pulse Resp SpO2  ?08/02/21 0603 (!) 147/87 98.6 ?F (37 ?C) Oral 67 18 96 %  ?08/02/21 0128 (!) 144/86 98 ?F (36.7 ?C) Oral  71 18 95 %  ?08/01/21 2147 (!) 158/88 (!) 97.5 ?F (36.4 ?C) Oral 73 18 97 %  ?  ? ?Recent laboratory studies:  ?Recent Labs  ?  08/01/21 ?0859 08/02/21 ?8413  ?WBC 4.9 10.2  ?HGB 12.0* 12.2*  ?HCT 35.7* 36.0*  ?PLT 149* 167  ?NA 138 137  ?K 4.3 4.6  ?CL 111 107  ?CO2 19* 24  ?BUN 14 18  ?CREATININE 0.63 0.87  ?GLUCOSE 76 150*  ?CALCIUM 7.5* 8.6*  ? ? ? ?Discharge Medications:   ?Allergies as of 08/02/2021   ? ?   Reactions  ? Anacin [aspirin-caffeine]   ? Can't remember the reaction but think it would kill him if he took it again  ? ?  ? ?  ?Medication List  ?  ? ?STOP taking these medications   ? ?GOODYS BODY PAIN PO ?  ?traMADol 50 MG tablet ?Commonly known as: ULTRAM ?  ? ?  ? ?TAKE these medications   ? ?amLODipine 10 MG tablet ?Commonly known as: NORVASC ?Take 10 mg by mouth daily. ?  ?aspirin 81 MG chewable tablet ?Chew 1 tablet (81 mg total) by mouth 2 (two) times daily. ?  ?ezetimibe 10 MG tablet ?Commonly known as: ZETIA ?Take 10 mg by mouth daily. ?  ?fenofibrate 48 MG tablet ?Commonly known as: TRICOR ?Take 48 mg by mouth at bedtime. ?  ?finasteride 5 MG tablet ?Commonly known as: PROSCAR ?Take 5 mg  by mouth daily. ?  ?GERITOL COMPLETE PO ?Take 1 tablet by mouth every 7 (seven) days. ?  ?lisinopril 40 MG tablet ?Commonly known as: ZESTRIL ?Take 40 mg by mouth daily. ?  ?methocarbamol 500 MG tablet ?Commonly known as: ROBAXIN ?Take 1 tablet (500 mg total) by mouth every 6 (six) hours as needed for muscle spasms. ?  ?metoprolol succinate 25 MG 24 hr tablet ?Commonly known as: TOPROL-XL ?Take 25 mg by mouth daily before breakfast. Take with or immediately following a meal. ?  ?oxyCODONE 5 MG immediate release tablet ?Commonly known as: Oxy IR/ROXICODONE ?Take 1 tablet (5 mg total) by mouth every 6 (six) hours as needed for moderate pain (pain score 4-6). ?  ?PRESERVISION AREDS 2 PO ?Take 1 capsule by mouth in the morning and at bedtime. ?  ?QUNOL ULTRA COQ10 PO ?Take 1 capsule by mouth daily. ?  ?rosuvastatin  20 MG tablet ?Commonly known as: CRESTOR ?Take 20 mg by mouth every Friday. ?  ? ?  ? ?  ?  ? ? ?  ?Durable Medical Equipment  ?(From admission, onward)  ?  ? ? ?  ? ?  Start     Ordered  ? 08/01/21 1030  DME 3 n 1  Once       ? 08/01/21 1029  ? 08/01/21 1030  DME Walker rolling  Once       ?Question Answer Comment  ?Walker: With 5 Inch Wheels   ?Patient needs a walker to treat with the following condition Status post total replacement of right hip   ?  ? 08/01/21 1029  ? ?  ?  ? ?  ? ? ?Diagnostic Studies: DG Pelvis Portable ? ?Result Date: 08/01/2021 ?CLINICAL DATA:  Status post total right hip arthroplasty. EXAM: PORTABLE PELVIS 1-2 VIEWS COMPARISON:  Pelvis and right hip radiographs 05/29/2021 FINDINGS: Interval total right hip arthroplasty. Redemonstration of total left hip arthroplasty. No perihardware lucency is seen to indicate hardware failure or loosening. Expected postoperative changes about the lateral right hip including subcutaneous air, soft tissue swelling and lateral surgical skin staples. No acute fracture or dislocation. IMPRESSION: Interval total right hip arthroplasty without evidence of hardware failure. Electronically Signed   By: Neita Garnet M.D.   On: 08/01/2021 11:23  ? ?DG C-Arm 1-60 Min-No Report ? ?Result Date: 08/01/2021 ?Fluoroscopy was utilized by the requesting physician.  No radiographic interpretation.  ? ?DG HIP UNILAT WITH PELVIS 1V RIGHT ? ?Result Date: 08/01/2021 ?CLINICAL DATA:  Right total hip arthroplasty. EXAM: DG HIP (WITH OR WITHOUT PELVIS) 1V RIGHT COMPARISON:  Radiographs 05/29/2021 FINDINGS: Three intraoperative fluoroscopic spot images demonstrate a well seated right total hip arthroplasty. No complicating features are identified. IMPRESSION: Well seated right total hip arthroplasty. Electronically Signed   By: Rudie Meyer M.D.   On: 08/01/2021 08:41   ? ?Disposition: Discharge disposition: 06-Home-Health Care Svc ? ? ? ? ? ? ? ? ? Follow-up Information   ? ?  Kathryne Hitch, MD. Go on 08/15/2021.   ?Specialty: Orthopedic Surgery ?Why: at 1:15 pm for your first post op appointment with Dr. Magnus Ivan in his office ?Contact information: ?[address] ?[place] [address] ?[phone] ? ? ?  ?  ? ? [facility] Follow up.   ?Specialty: Home Health Services ?Why: Someone from the home health agency will be in contact with you after discharge to schedule you for your first in home physical therapy appointment ?Contact information: ?[address] ?[address] ?  [place] [address] ?[phone] ? ? ?  ?  ? ?  ?  ? ?  ? ? ? ?Signed: ?Jeana Kersting ?08/02/2021, 2:42 PM ? ? ? ?

## 2021-08-02 NOTE — Op Note (Signed)
NAME: TYRONE, MESMER ?MEDICAL RECORD NO: [id number] ?ACCOUNT NO: [id number] ?DATE OF BIRTH: 1945-11-27 ?FACILITY: [facility] ?LOCATION: [facility] ?PHYSICIAN: Vanita Panda. Magnus Ivan, MD ? ?Operative Report  ? ?DATE OF PROCEDURE: 08/01/2021 ? ?PREOPERATIVE DIAGNOSIS:  Primary osteoarthritis and degenerative joint disease, right hip. ? ?POSTOPERATIVE DIAGNOSIS:  Primary osteoarthritis and degenerative joint disease, right hip. ? ?PROCEDURE:  Right total hip arthroplasty through direct anterior approach. ? ?IMPLANTS:  DePuy Sector Gription acetabular component size 54, size 36+4 neutral polyethylene liner, size 14 Corail femoral component with standard offset, size 36+1.5 metal hip ball. ? ?SURGEON:  Vanita Panda. Magnus Ivan, MD ? ?ASSISTANT:  Richardean Canal, PA-C. ? ?ANESTHESIA:  Spinal. ? ?ANTIBIOTICS:  2 g IV Ancef. ? ?ESTIMATED BLOOD LOSS:  200 mL. ? ?COMPLICATIONS:  None. ? ?INDICATIONS:  The patient is a 76 year old gentleman with debilitating arthritis involving his right hip.  He does have a history of a left total hip arthroplasty done in 2014.  At this point, his right hip pain is daily and is detrimentally affecting  ?his mobility, his quality of life and his activities of daily living.  His x-rays do show severe arthritis of the right hip.  Having had the surgery before, he is fully aware of the risk of acute blood loss anemia, nerve or vessel injury, fracture,  ?infection, dislocation, DVT, implant failure, leg length differences and skin and soft tissue issues.  He understands our goals are to decrease pain, improve mobility and overall improve quality of life. ? ?DESCRIPTION OF PROCEDURE:  After informed consent was obtained, appropriate right hip was marked.  He was brought to the operating room and sat up on the stretcher where spinal anesthesia was obtained.  He was laid in supine position on the stretcher.   ?Foley catheter was placed and traction boots were placed on both his feet.  Next, he was placed supine on  the Hana fracture table, the perineal post in place and both legs in line skeletal traction device and no traction applied.  His right operative hip ? was prepped and draped with DuraPrep and sterile drapes.  A timeout was called and he was identified as correct patient, correct right hip.  I then made an incision just inferior and posterior to the anterior superior iliac spine and carried this  ?obliquely down the leg.  We dissected down tensor fascia lata muscle.  Tensor fascia was then divided longitudinally to proceed with direct anterior approach to the hip.  I identified and cauterized circumflex vessels and identified the hip capsule,  ?opened the hip capsule in L-type format, finding a moderate joint effusion and significant arthritis around his right hip.  Cobra retractors were placed around the medial and lateral femoral neck and made a femoral neck cut with oscillating saw and  ?completed this with the osteotome.  We placed a corkscrew guide in the femoral head and removed the femoral head in its entirety and found a wide area devoid of cartilage.  I then placed a bent Hohmann over the medial acetabular rim and removed remnants  ?of acetabular labrum and other debris.  I then began reaming under direct visualization from a size 43 reamer in a stepwise increments going up to a size 53 reamer. With all reamers placed under direct visualization, last reamer was placed under direct  ?fluoroscopy, so I could obtain my depth of reaming, inclination and anteversion.  I then placed real DePuy Sector Gription acetabular component size 54 and a 36+4 polyethylene liner based off his offset.  Attention was then turned to the femur.  With the ? leg externally rotated to 120 degrees, extended and adducted, we were able to place a Mueller retractor medially and Hohmann retractor behind the greater trochanter.  We released lateral joint capsule and used a box cutting osteotome to enter femoral  ?canal and a rongeur to  lateralize.  I then began broaching using the Corail broaching system from size 8, going to a size 14.  With a size 14 in place, we trialed a standard offset femoral neck and a 36+1.5 hip ball.  We brought the leg back over and up  ?and with traction and internal rotation reduced in the pelvis and we had gotten his leg length back to where he was preop.  Of note, he is long on the right side because he had such a severe deformity and on the left side, he is short and he understands  ?we cannot shorten him on this side for stability purposes.  We then dislocated the hip, removed the trial components.  I placed the real Corail femoral component, size 14 with standard offset and the real 36+1.5 metal hip ball and again reduced this in  ?acetabulum and it was stable.  We assessed this mechanically and radiographically.  We then irrigated the soft tissue with normal saline solution.  We closed the joint capsule with interrupted #1 Ethibond suture followed by #1 Vicryl to close the tensor  ?fascia.  0 Vicryl was used to close deep tissue and 2-0 Vicryl was used to close subcutaneous tissue.  The skin was closed with staples.  An Aquacel dressing was applied.  He was taken off the Hana table and taken to recovery room in stable condition  ?with all final counts being correct.  No complications noted.  Of note, Rexene Edison, PA-C did assist in entirety of the case and assistance was crucial for facilitating every aspect of this case from beginning to end.  His assistance was crucial for  ?retracting of soft tissues and mobilizing the soft tissues as well as helping guide implant placement.  He was involved in direct layered closure of the wound.  His involvement was medically necessary. ? ? ?NIK ?D: 08/01/2021 8:26:22 am T: 08/02/2021 1:13:00 am  ?JOB: [id number]/ [id number]  ?

## 2021-08-02 NOTE — Progress Notes (Signed)
Subjective: ?1 Day Post-Op Procedure(s) (LRB): ?RIGHT TOTAL HIP ARTHROPLASTY ANTERIOR APPROACH (Right) ?Patient reports pain as moderate.  Wanting to discharge to home. Eating well. Voiding.  ? ?Objective: ?Vital signs in last 24 hours: ?Temp:  [97.5 ?F (36.4 ?C)-98.6 ?F (37 ?C)] 98.6 ?F (37 ?C) (04/14 0086) ?Pulse Rate:  [67-73] 67 (04/14 0603) ?Resp:  [18] 18 (04/14 0603) ?BP: (144-158)/(86-88) 147/87 (04/14 0603) ?SpO2:  [95 %-97 %] 96 % (04/14 0603) ? ?Intake/Output from previous day: ?04/13 0701 - 04/14 0700 ?In: 3554.9 [P.O.:660; I.V.:2744.9; IV Piggyback:150] ?Out: 1600 [Urine:1400; Blood:200] ?Intake/Output this shift: ?No intake/output data recorded. ? ?Recent Labs  ?  08/01/21 ?0859 08/02/21 ?7619  ?HGB 12.0* 12.2*  ? ?Recent Labs  ?  08/01/21 ?0859 08/02/21 ?5093  ?WBC 4.9 10.2  ?RBC 3.61* 3.69*  ?HCT 35.7* 36.0*  ?PLT 149* 167  ? ?Recent Labs  ?  08/01/21 ?0859 08/02/21 ?2671  ?NA 138 137  ?K 4.3 4.6  ?CL 111 107  ?CO2 19* 24  ?BUN 14 18  ?CREATININE 0.63 0.87  ?GLUCOSE 76 150*  ?CALCIUM 7.5* 8.6*  ? ?No results for input(s): LABPT, INR in the last 72 hours. ? ?Right lower extremity: ?Dorsiflexion/Plantar flexion intact ?Incision: moderate drainage ?Compartment soft ? ? ?Assessment/Plan: ?1 Day Post-Op Procedure(s) (LRB): ?RIGHT TOTAL HIP ARTHROPLASTY ANTERIOR APPROACH (Right) ?Discharge home with home health ? ? ? ? ? ?Lee Torres ?08/02/2021, 1:50 PM ? ?

## 2021-08-02 NOTE — Progress Notes (Signed)
Physical Therapy Treatment ?Patient Details ?Name: Lee Torres ?MRN: [id number] ?DOB: 04/25/1945 ?Today's Date: 08/02/2021 ? ? ?History of Present Illness Pt s/p R THR and with hx of L THR and legally blind in R eye ? ?  ?PT Comments  ? ? Pt seen POD1 and requires min guard for transfers; ambulated 80 feet with RW and min guard assist, including stair training. Provided HEP handout and reviewed verbally with pt, encouraged completion at home. Patient will benefit from continued skilled PT interventions to address impairments and progress towards PLOF. Acute PT will follow to progress mobility and stair training in preparation for safe discharge home.  ?  ?Recommendations for follow up therapy are one component of a multi-disciplinary discharge planning process, led by the attending physician.  Recommendations may be updated based on patient status, additional functional criteria and insurance authorization. ? ?Follow Up Recommendations ? Follow physician's recommendations for discharge plan and follow up therapies ?  ?  ?Assistance Recommended at Discharge Frequent or constant Supervision/Assistance  ?Patient can return home with the following A little help with walking and/or transfers;A little help with bathing/dressing/bathroom;Assistance with cooking/housework;Help with stairs or ramp for entrance;Assist for transportation ?  ?Equipment Recommendations ? None recommended by PT  ?  ?Recommendations for Other Services   ? ? ?  ?Precautions / Restrictions Precautions ?Precautions: Fall ?Restrictions ?Weight Bearing Restrictions: No ?Other Position/Activity Restrictions: WBAT  ?  ? ?Mobility ? Bed Mobility ?  ?  ?  ?  ?  ?  ?  ?General bed mobility comments: Pt seated in recliner upon entry and exit ?  ? ?Transfers ?Overall transfer level: Needs assistance ?Equipment used: Rolling walker (2 wheels) ?Transfers: Sit to/from Stand ?Sit to Stand: Supervision ?  ?  ?  ?  ?  ?General transfer comment: Pt supervision for  safety only, no physical assist required. BUE use on recliner arms. ?  ? ?Ambulation/Gait ?Ambulation/Gait assistance: Min guard ?Gait Distance (Feet): 80 Feet ?Assistive device: Rolling walker (2 wheels) ?Gait Pattern/deviations: Step-to pattern, Step-through pattern, Decreased step length - right, Decreased step length - left, Trunk flexed ?Gait velocity: decr ?  ?  ?General Gait Details: Pt min guard with recliner follow for safety only, no physical assist required. Pt ambulated ~1ft with RW and demonstrated flexed trunk, decreased step length bilaterally. VCs for proximity to device and fluidity of movement "use the walker like a grocery cart." No Overt LOB. ? ? ?Stairs ?Stairs: Yes ?Stairs assistance: Min guard ?Stair Management: Two rails, Step to pattern, Forwards ?Number of Stairs: 3 ?General stair comments: Pt educated on proper stair management and verbalized understanding. Min guard for safety only, no physical assist required. Pt demonstrated safe technique with minimal cuing. Educated pt on caregiver assistance, location, and use of gait belt during stair ascent/descent upon discharge and using the stairs that have bilateral rails at the front of the house. Pt verbalized understanding. ? ? ?Wheelchair Mobility ?  ? ?Modified Rankin (Stroke Patients Only) ?  ? ? ?  ?Balance Overall balance assessment: Needs assistance ?Sitting-balance support: Feet supported, No upper extremity supported ?Sitting balance-Leahy Scale: Good ?  ?  ?Standing balance support: Bilateral upper extremity supported ?Standing balance-Leahy Scale: Poor ?  ?  ?  ?  ?  ?  ?  ?  ?  ?  ?  ?  ?  ? ?  ?Cognition Arousal/Alertness: Awake/alert ?Behavior During Therapy: [facility] for tasks assessed/performed ?Overall Cognitive Status: Within Functional Limits for tasks assessed ?  ?  ?  ?  ?  ?  ?  ?  ?  ?  ?  ?  ?  ?  ?  ?  ?  ?  ?  ? ?  ?  Exercises Total Joint Exercises ?Ankle Circles/Pumps: AROM, Both, 15 reps, Supine ? ?  ?General Comments    ?  ?  ? ?Pertinent Vitals/Pain Pain Assessment ?Pain Assessment: 0-10 ?Pain Score: 8  ?Pain Location: R hip ?Pain Descriptors / Indicators: Aching, Sore, Operative site guarding, Grimacing, Squeezing  ? ? ?Home Living   ?  ?  ?  ?  ?  ?  ?  ?  ?  ?   ?  ?Prior Function    ?  ?  ?   ? ?PT Goals (current goals can now be found in the care plan section) Acute Rehab PT Goals ?Patient Stated Goal: Regain IND ?PT Goal Formulation: With patient ?Time For Goal Achievement: 08/08/21 ?Potential to Achieve Goals: Good ?Progress towards PT goals: Progressing toward goals ? ?  ?Frequency ? ? ? 7X/week ? ? ? ?  ?PT Plan Current plan remains appropriate  ? ? ?Co-evaluation   ?  ?  ?  ?  ? ?  ?AM-PAC PT "6 Clicks" Mobility   ?Outcome Measure ? Help needed turning from your back to your side while in a flat bed without using bedrails?: A Lot ?Help needed moving from lying on your back to sitting on the side of a flat bed without using bedrails?: A Lot ?Help needed moving to and from a bed to a chair (including a wheelchair)?: A Little ?Help needed standing up from a chair using your arms (e.g., wheelchair or bedside chair)?: A Little ?Help needed to walk in hospital room?: A Little ?Help needed climbing 3-5 steps with a railing? : A Little ?6 Click Score: 16 ? ?  ?End of Session Equipment Utilized During Treatment: Gait belt ?Activity Tolerance: Patient tolerated treatment well ?Patient left: in chair;with call bell/phone within reach;with chair alarm set;with family/visitor present ?Nurse Communication: Mobility status ?PT Visit Diagnosis: Difficulty in walking, not elsewhere classified (R26.2) ?  ? ? ?Time: 1610-9604 ?PT Time Calculation (min) (ACUTE ONLY): 24 min ? ?Charges:  $Gait Training: 23-37 mins          ?          ?Jamesetta Geralds, PT, DPT ?[facility] ?Office: [phone] ?Pager: [phone] ? ? ?Jamesetta Geralds ?08/02/2021, 10:52 AM ? ?

## 2021-08-02 NOTE — Discharge Instructions (Signed)

## 2021-08-02 NOTE — TOC Transition Note (Signed)
Transition of Care (TOC) - CM/SW Discharge Note ? ?Patient Details  ?Name: Lee Torres ?MRN: [id number] ?Date of Birth: 1945/11/10 ? ?Transition of Care (TOC) CM/SW Contact:  ?Sherie Don, LCSW ?Phone Number: ?08/02/2021, 9:35 AM ? ?Clinical Narrative: Patient is expected to discharge home after working with PT. CSW met with patient to confirm discharge plan. Patient will discharge home with HHPT through [facility]. Patient has a rolling walker and 3N1 at home, so there are no DME needs at this time. TOC signing off. ? ?Final next level of care: [place] ?Barriers to Discharge: No Barriers Identified ? ?Patient Goals and CMS Choice ?Patient states their goals for this hospitalization and ongoing recovery are:: Discharge home with HHPT ?CMS Medicare.gov Compare Post Acute Care list provided to:: Patient ?Choice offered to / list presented to : Patient ? ?Discharge Plan and Services      ?DME Arranged: N/A ?DME Agency: NA ?HH Arranged: PT ?[place] Agency: [facility] ?Representative spoke with at [place]: Prearranged in orthopedist's office ? ?Readmission Risk Interventions ?   ? View : No data to display.  ?  ?  ?  ? ?

## 2021-08-03 DIAGNOSIS — I10 Essential (primary) hypertension: Secondary | ICD-10-CM | POA: Diagnosis not present

## 2021-08-03 DIAGNOSIS — E7800 Pure hypercholesterolemia, unspecified: Secondary | ICD-10-CM | POA: Diagnosis not present

## 2021-08-03 DIAGNOSIS — Z96641 Presence of right artificial hip joint: Secondary | ICD-10-CM | POA: Diagnosis not present

## 2021-08-03 DIAGNOSIS — Z9181 History of falling: Secondary | ICD-10-CM | POA: Diagnosis not present

## 2021-08-03 DIAGNOSIS — Z7982 Long term (current) use of aspirin: Secondary | ICD-10-CM | POA: Diagnosis not present

## 2021-08-03 DIAGNOSIS — H548 Legal blindness, as defined in USA: Secondary | ICD-10-CM | POA: Diagnosis not present

## 2021-08-03 DIAGNOSIS — Z471 Aftercare following joint replacement surgery: Secondary | ICD-10-CM | POA: Diagnosis not present

## 2021-08-03 DIAGNOSIS — M4807 Spinal stenosis, lumbosacral region: Secondary | ICD-10-CM | POA: Diagnosis not present

## 2021-08-03 DIAGNOSIS — Z87891 Personal history of nicotine dependence: Secondary | ICD-10-CM | POA: Diagnosis not present

## 2021-08-06 DIAGNOSIS — E7800 Pure hypercholesterolemia, unspecified: Secondary | ICD-10-CM | POA: Diagnosis not present

## 2021-08-06 DIAGNOSIS — M4807 Spinal stenosis, lumbosacral region: Secondary | ICD-10-CM | POA: Diagnosis not present

## 2021-08-06 DIAGNOSIS — Z7982 Long term (current) use of aspirin: Secondary | ICD-10-CM | POA: Diagnosis not present

## 2021-08-06 DIAGNOSIS — I10 Essential (primary) hypertension: Secondary | ICD-10-CM | POA: Diagnosis not present

## 2021-08-06 DIAGNOSIS — Z471 Aftercare following joint replacement surgery: Secondary | ICD-10-CM | POA: Diagnosis not present

## 2021-08-06 DIAGNOSIS — H548 Legal blindness, as defined in USA: Secondary | ICD-10-CM | POA: Diagnosis not present

## 2021-08-07 DIAGNOSIS — H548 Legal blindness, as defined in USA: Secondary | ICD-10-CM | POA: Diagnosis not present

## 2021-08-07 DIAGNOSIS — I10 Essential (primary) hypertension: Secondary | ICD-10-CM | POA: Diagnosis not present

## 2021-08-07 DIAGNOSIS — M4807 Spinal stenosis, lumbosacral region: Secondary | ICD-10-CM | POA: Diagnosis not present

## 2021-08-07 DIAGNOSIS — E7800 Pure hypercholesterolemia, unspecified: Secondary | ICD-10-CM | POA: Diagnosis not present

## 2021-08-07 DIAGNOSIS — Z471 Aftercare following joint replacement surgery: Secondary | ICD-10-CM | POA: Diagnosis not present

## 2021-08-07 DIAGNOSIS — Z7982 Long term (current) use of aspirin: Secondary | ICD-10-CM | POA: Diagnosis not present

## 2021-08-09 DIAGNOSIS — Z471 Aftercare following joint replacement surgery: Secondary | ICD-10-CM | POA: Diagnosis not present

## 2021-08-09 DIAGNOSIS — Z7982 Long term (current) use of aspirin: Secondary | ICD-10-CM | POA: Diagnosis not present

## 2021-08-09 DIAGNOSIS — I10 Essential (primary) hypertension: Secondary | ICD-10-CM | POA: Diagnosis not present

## 2021-08-09 DIAGNOSIS — H548 Legal blindness, as defined in USA: Secondary | ICD-10-CM | POA: Diagnosis not present

## 2021-08-09 DIAGNOSIS — E7800 Pure hypercholesterolemia, unspecified: Secondary | ICD-10-CM | POA: Diagnosis not present

## 2021-08-09 DIAGNOSIS — M4807 Spinal stenosis, lumbosacral region: Secondary | ICD-10-CM | POA: Diagnosis not present

## 2021-08-12 ENCOUNTER — Telehealth: Payer: Self-pay | Admitting: *Deleted

## 2021-08-12 ENCOUNTER — Other Ambulatory Visit: Payer: Self-pay | Admitting: Orthopaedic Surgery

## 2021-08-12 DIAGNOSIS — M4807 Spinal stenosis, lumbosacral region: Secondary | ICD-10-CM | POA: Diagnosis not present

## 2021-08-12 DIAGNOSIS — E7800 Pure hypercholesterolemia, unspecified: Secondary | ICD-10-CM | POA: Diagnosis not present

## 2021-08-12 DIAGNOSIS — Z471 Aftercare following joint replacement surgery: Secondary | ICD-10-CM | POA: Diagnosis not present

## 2021-08-12 DIAGNOSIS — Z7982 Long term (current) use of aspirin: Secondary | ICD-10-CM | POA: Diagnosis not present

## 2021-08-12 DIAGNOSIS — H548 Legal blindness, as defined in USA: Secondary | ICD-10-CM | POA: Diagnosis not present

## 2021-08-12 DIAGNOSIS — I10 Essential (primary) hypertension: Secondary | ICD-10-CM | POA: Diagnosis not present

## 2021-08-12 MED ORDER — OXYCODONE HCL 5 MG PO TABS: 5.0000 mg | ORAL_TABLET | Freq: Four times a day (QID) | ORAL | 0 refills | Status: AC | PRN

## 2021-08-12 MED ORDER — METHOCARBAMOL 500 MG PO TABS: 500.0000 mg | ORAL_TABLET | Freq: Four times a day (QID) | ORAL | 1 refills | Status: AC | PRN

## 2021-08-12 NOTE — Telephone Encounter (Signed)
Call from patient requesting refill of pain medication. [facility] in [place] on chart.  ?

## 2021-08-15 ENCOUNTER — Telehealth: Payer: Self-pay | Admitting: *Deleted

## 2021-08-15 ENCOUNTER — Ambulatory Visit (HOSPITAL_COMMUNITY)
Admission: RE | Admit: 2021-08-15 | Discharge: 2021-08-15 | Disposition: A | Discharge status: Home / Self Care | Payer: Medicare Other | Source: Ambulatory Visit | Attending: Orthopaedic Surgery | Admitting: Orthopaedic Surgery

## 2021-08-15 ENCOUNTER — Other Ambulatory Visit: Payer: Self-pay

## 2021-08-15 ENCOUNTER — Telehealth: Payer: Self-pay

## 2021-08-15 ENCOUNTER — Ambulatory Visit (INDEPENDENT_AMBULATORY_CARE_PROVIDER_SITE_OTHER): Payer: Medicare Other | Admitting: Orthopaedic Surgery

## 2021-08-15 ENCOUNTER — Encounter: Payer: Self-pay | Admitting: Orthopaedic Surgery

## 2021-08-15 DIAGNOSIS — Z96641 Presence of right artificial hip joint: Secondary | ICD-10-CM | POA: Insufficient documentation

## 2021-08-15 DIAGNOSIS — I10 Essential (primary) hypertension: Secondary | ICD-10-CM | POA: Diagnosis not present

## 2021-08-15 DIAGNOSIS — Z7982 Long term (current) use of aspirin: Secondary | ICD-10-CM | POA: Diagnosis not present

## 2021-08-15 DIAGNOSIS — H548 Legal blindness, as defined in USA: Secondary | ICD-10-CM | POA: Diagnosis not present

## 2021-08-15 DIAGNOSIS — Z471 Aftercare following joint replacement surgery: Secondary | ICD-10-CM | POA: Diagnosis not present

## 2021-08-15 DIAGNOSIS — M4807 Spinal stenosis, lumbosacral region: Secondary | ICD-10-CM | POA: Diagnosis not present

## 2021-08-15 DIAGNOSIS — E7800 Pure hypercholesterolemia, unspecified: Secondary | ICD-10-CM | POA: Diagnosis not present

## 2021-08-15 NOTE — Telephone Encounter (Signed)
Ortho bundle 14 day in office appointment completed. ?

## 2021-08-15 NOTE — Progress Notes (Signed)
The patient comes in today 2 weeks status post a right total hip arthroplasty.  We have replaced his left hip over 9 years ago.  He said this was a little bit harder for him. ? ?His incision looks good to remove staples.  There is a hematoma.  His calf that was swollen and has some pain posteriorly on the right calf.  His feet are swollen as well.  He has not been wearing compressive garments but he has been taking a baby aspirin twice a day. ? ?We do need to send him for a Doppler ultrasound of the right lower extremity to rule out a DVT.  Right now he will continue an aspirin a day until further notice.  We will call him with the results of the Doppler whether or not we need to do stronger blood thinner.  From my standpoint to ?We will see him is in 4 weeks to see how he is doing overall.  No x-rays needed at that visit. ?

## 2021-08-15 NOTE — Progress Notes (Signed)
Lower extremity venous RT study completed. ? ?Preliminary results relayed to [place] for Ninfa Linden, MD. ? ?See CV Proc for preliminary results report.  ? ?Darlin Coco, RDMS, RVT ? ?

## 2021-08-15 NOTE — Telephone Encounter (Signed)
[facility] lab called and states that the pt is negative for DVT and he is aware.  ?

## 2021-09-12 ENCOUNTER — Telehealth: Payer: Self-pay | Admitting: *Deleted

## 2021-09-12 ENCOUNTER — Encounter: Payer: Self-pay | Admitting: Orthopaedic Surgery

## 2021-09-12 ENCOUNTER — Ambulatory Visit (INDEPENDENT_AMBULATORY_CARE_PROVIDER_SITE_OTHER): Payer: Medicare Other | Admitting: Orthopaedic Surgery

## 2021-09-12 DIAGNOSIS — Z96641 Presence of right artificial hip joint: Secondary | ICD-10-CM

## 2021-09-12 NOTE — Telephone Encounter (Signed)
Ortho bundle 30 day call and survey completed. ?

## 2021-09-12 NOTE — Progress Notes (Signed)
The patient is now 6 weeks status post a right total hip arthroplasty.  He is ambulating cane and says he is getting there in terms of range of motion and strength.  We replaced his left hip many years ago.  He is reporting right knee pain and shin pain but overall seems to be doing well.  He denies any fever, chills, nausea, vomiting  Thus far his right hip incision still looks good.  There is firmness to this that will loosen with time of let him know to try heating pad.  He feels good in terms of May but has had the range of motion with appropriate pain.  This point I will need to see him back for 6 months unless there is issues.  We will have a standing low AP pelvis and lateral of his more recent right operative hip at that visit.  All question concerns were answered and addressed.

## 2021-09-17 DIAGNOSIS — E7800 Pure hypercholesterolemia, unspecified: Secondary | ICD-10-CM | POA: Diagnosis not present

## 2021-09-17 DIAGNOSIS — Z79899 Other long term (current) drug therapy: Secondary | ICD-10-CM | POA: Diagnosis not present

## 2021-09-17 DIAGNOSIS — I10 Essential (primary) hypertension: Secondary | ICD-10-CM | POA: Diagnosis not present

## 2021-09-17 DIAGNOSIS — Z1331 Encounter for screening for depression: Secondary | ICD-10-CM | POA: Diagnosis not present

## 2021-09-17 DIAGNOSIS — Z6836 Body mass index (BMI) 36.0-36.9, adult: Secondary | ICD-10-CM | POA: Diagnosis not present

## 2021-09-17 DIAGNOSIS — Z0000 Encounter for general adult medical examination without abnormal findings: Secondary | ICD-10-CM | POA: Diagnosis not present

## 2021-09-20 DIAGNOSIS — D519 Vitamin B12 deficiency anemia, unspecified: Secondary | ICD-10-CM | POA: Diagnosis not present

## 2021-09-27 DIAGNOSIS — D519 Vitamin B12 deficiency anemia, unspecified: Secondary | ICD-10-CM | POA: Diagnosis not present

## 2021-10-04 DIAGNOSIS — D519 Vitamin B12 deficiency anemia, unspecified: Secondary | ICD-10-CM | POA: Diagnosis not present

## 2021-10-11 DIAGNOSIS — D519 Vitamin B12 deficiency anemia, unspecified: Secondary | ICD-10-CM | POA: Diagnosis not present

## 2021-10-16 ENCOUNTER — Telehealth: Payer: Self-pay | Admitting: *Deleted

## 2021-10-16 NOTE — Telephone Encounter (Signed)
Ortho bundle 60 day call completed.  

## 2021-11-15 DIAGNOSIS — E538 Deficiency of other specified B group vitamins: Secondary | ICD-10-CM | POA: Diagnosis not present

## 2021-12-27 DIAGNOSIS — D519 Vitamin B12 deficiency anemia, unspecified: Secondary | ICD-10-CM | POA: Diagnosis not present

## 2022-03-17 ENCOUNTER — Encounter: Payer: Self-pay | Admitting: Orthopaedic Surgery

## 2022-03-17 ENCOUNTER — Ambulatory Visit (INDEPENDENT_AMBULATORY_CARE_PROVIDER_SITE_OTHER): Payer: Medicare Other

## 2022-03-17 ENCOUNTER — Ambulatory Visit (INDEPENDENT_AMBULATORY_CARE_PROVIDER_SITE_OTHER): Payer: Medicare Other | Admitting: Orthopaedic Surgery

## 2022-03-17 DIAGNOSIS — Z96641 Presence of right artificial hip joint: Secondary | ICD-10-CM

## 2022-03-17 NOTE — Progress Notes (Signed)
The patient is now 7 months status post a right total hip arthroplasty.  We replaced his left hip over a decade ago.  He says he is good range of motion strength and doing well overall.  He still has some pain in the thigh in the groin.  On exam he is walking with just a slight limp but this is because he has a significant leg length discrepancy from his old side.  It has improved with our surgery.  He tolerated and putting both hips the range of motion with no difficulty at all.  His right hip incision is healed nicely.  An AP pelvis and lateral of his right hip shows well-seated implants bilaterally.  There is an cortical irregularity on the medial cortex of the femur but I can see this on all the films even before surgery.  It has not changed.  I would like to see him back at the 1 year standpoint from surgery.  I would like just an AP and lateral of the right hip at that visit.

## 2022-06-13 DIAGNOSIS — Z6837 Body mass index (BMI) 37.0-37.9, adult: Secondary | ICD-10-CM | POA: Diagnosis not present

## 2022-06-13 DIAGNOSIS — Z79899 Other long term (current) drug therapy: Secondary | ICD-10-CM | POA: Diagnosis not present

## 2022-06-13 DIAGNOSIS — Z91199 Patient's noncompliance with other medical treatment and regimen due to unspecified reason: Secondary | ICD-10-CM | POA: Diagnosis not present

## 2022-06-13 DIAGNOSIS — I6523 Occlusion and stenosis of bilateral carotid arteries: Secondary | ICD-10-CM | POA: Diagnosis not present

## 2022-06-13 DIAGNOSIS — E782 Mixed hyperlipidemia: Secondary | ICD-10-CM | POA: Diagnosis not present

## 2022-06-13 DIAGNOSIS — D510 Vitamin B12 deficiency anemia due to intrinsic factor deficiency: Secondary | ICD-10-CM | POA: Diagnosis not present

## 2022-06-13 DIAGNOSIS — I10 Essential (primary) hypertension: Secondary | ICD-10-CM | POA: Diagnosis not present

## 2022-07-11 DIAGNOSIS — D510 Vitamin B12 deficiency anemia due to intrinsic factor deficiency: Secondary | ICD-10-CM | POA: Diagnosis not present

## 2022-08-15 DIAGNOSIS — E538 Deficiency of other specified B group vitamins: Secondary | ICD-10-CM | POA: Diagnosis not present

## 2022-08-25 DIAGNOSIS — J302 Other seasonal allergic rhinitis: Secondary | ICD-10-CM | POA: Diagnosis not present

## 2022-08-25 DIAGNOSIS — J329 Chronic sinusitis, unspecified: Secondary | ICD-10-CM | POA: Diagnosis not present

## 2022-08-25 DIAGNOSIS — J40 Bronchitis, not specified as acute or chronic: Secondary | ICD-10-CM | POA: Diagnosis not present

## 2022-09-12 DIAGNOSIS — D519 Vitamin B12 deficiency anemia, unspecified: Secondary | ICD-10-CM | POA: Diagnosis not present

## 2022-09-15 ENCOUNTER — Ambulatory Visit: Payer: Medicare Other | Admitting: Orthopaedic Surgery

## 2022-09-17 ENCOUNTER — Encounter: Payer: Self-pay | Admitting: Orthopaedic Surgery

## 2022-09-17 ENCOUNTER — Telehealth: Payer: Self-pay | Admitting: *Deleted

## 2022-09-17 ENCOUNTER — Other Ambulatory Visit (INDEPENDENT_AMBULATORY_CARE_PROVIDER_SITE_OTHER): Payer: Medicare Other

## 2022-09-17 ENCOUNTER — Ambulatory Visit (INDEPENDENT_AMBULATORY_CARE_PROVIDER_SITE_OTHER): Payer: Medicare Other | Admitting: Orthopaedic Surgery

## 2022-09-17 DIAGNOSIS — Z96641 Presence of right artificial hip joint: Secondary | ICD-10-CM

## 2022-09-17 NOTE — Progress Notes (Signed)
The patient is well-known to me.  He is now around 14 months status post a right total hip replacement.  He is 10 years out from his left hip being replaced.  He has left hip with some shortening deformity we were never able to get him out the full length.  He does wear a shoe lift on the left side.  He says he is doing well overall.  He is 77 years old.  He reports good range of motion and strength and denies hip pain bilaterally.    Both hips are examined and have excellent range of motion.  An AP pelvis shows bilateral total hip arthroplasties and a lateral of the right hip shows that they are both well-seated with no complicating features other than his likely discrepancy.  At this point since he is doing well follow-up can be as needed.  If he does develop any issues he knows to let us know.  All questions and concerns were addressed and answered.

## 2022-09-17 NOTE — Telephone Encounter (Signed)
Ortho bundle 1 year call completed. ?

## 2022-10-17 DIAGNOSIS — D519 Vitamin B12 deficiency anemia, unspecified: Secondary | ICD-10-CM | POA: Diagnosis not present

## 2022-12-02 IMAGING — DX DG PORTABLE PELVIS
1 series · 1 of 1 positions shown · non-contrast
Comparison: Pelvis and right hip radiographs 05/29/2021

CLINICAL DATA: Status post total right hip arthroplasty.

EXAM:
PORTABLE PELVIS 1-2 VIEWS

[pelvis ap]
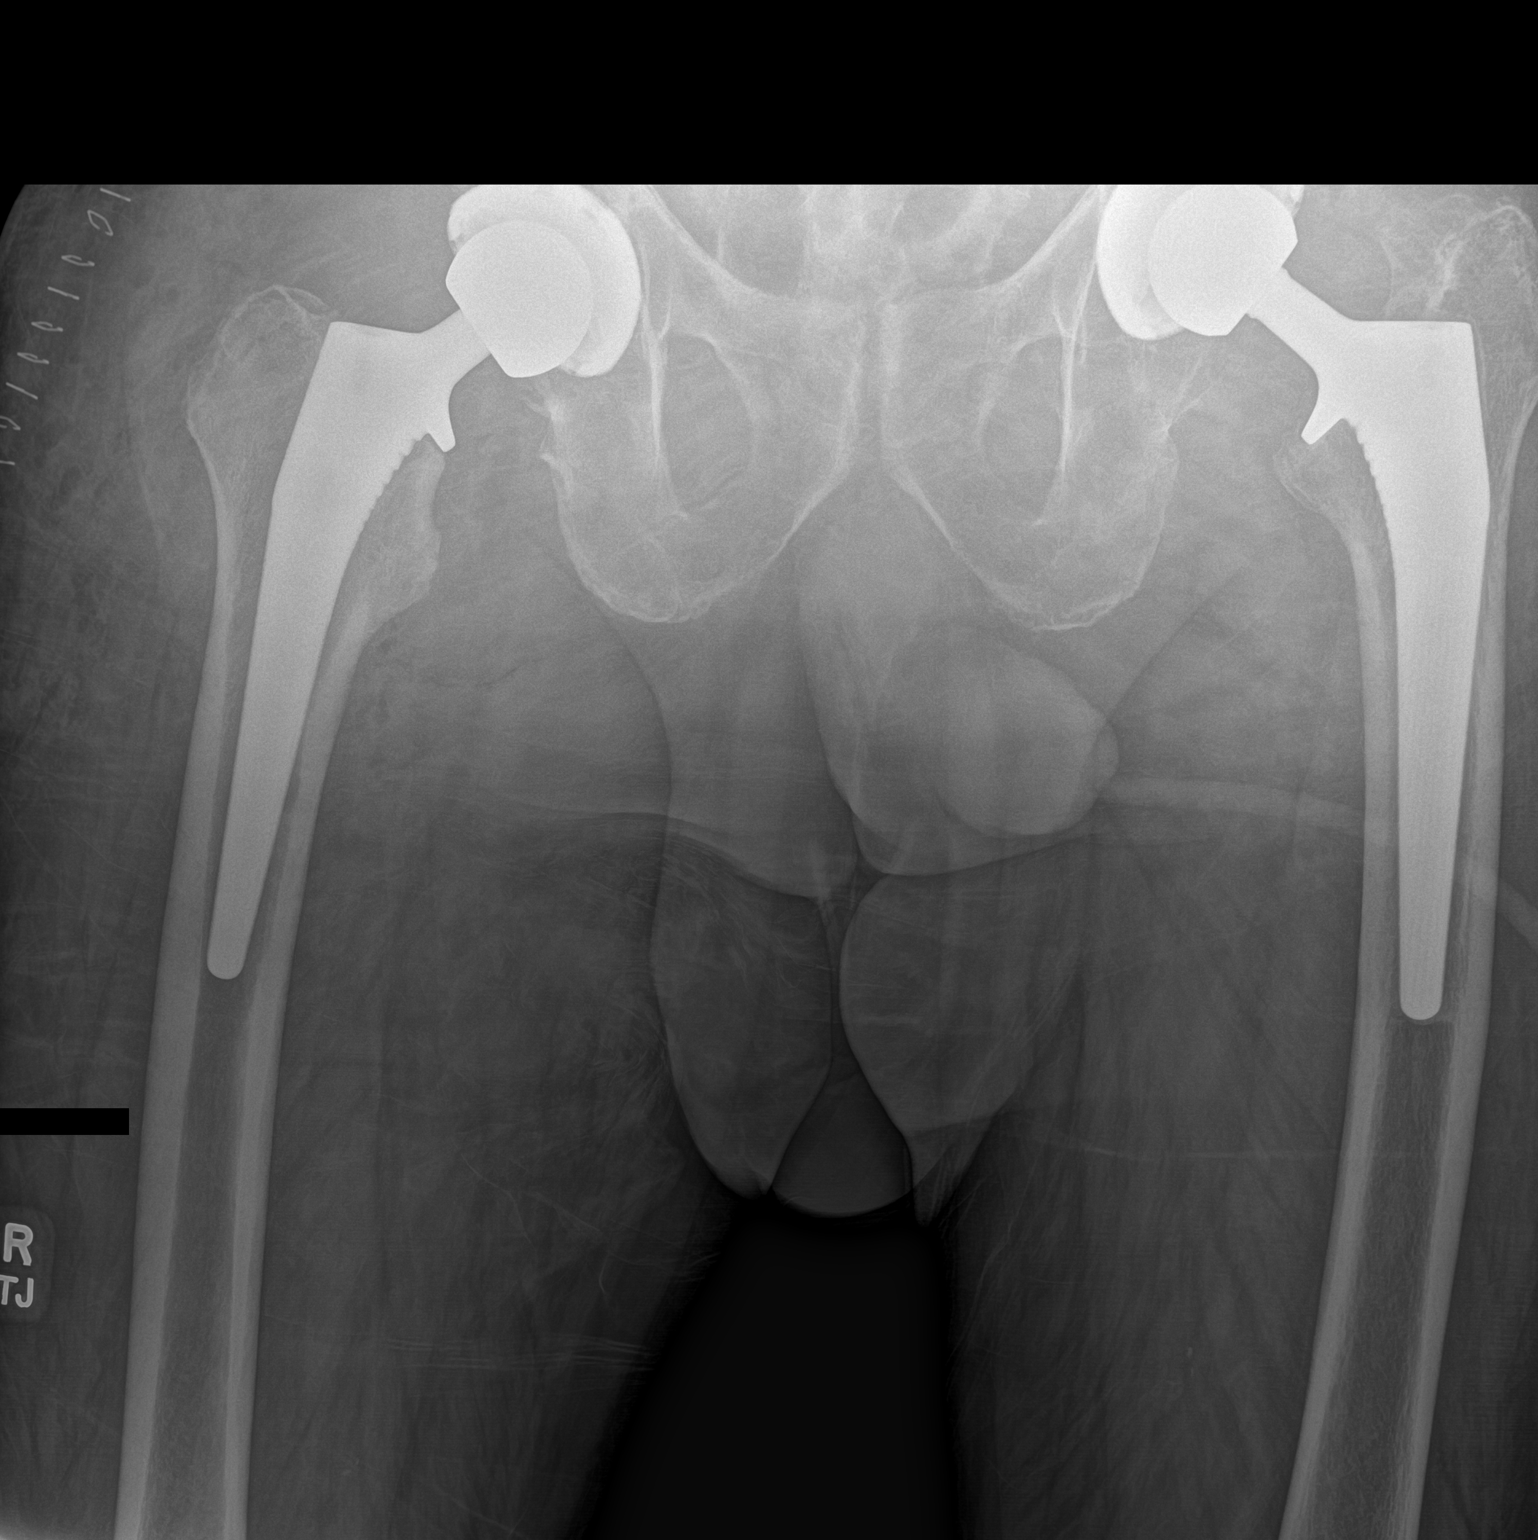

[1 of 1 positions shown; findings below may reference images not displayed]

FINDINGS: Interval total right hip arthroplasty. Redemonstration of total left
hip arthroplasty. No perihardware lucency is seen to indicate
hardware failure or loosening. Expected postoperative changes about
the lateral right hip including subcutaneous air, soft tissue
swelling and lateral surgical skin staples. No acute fracture or
dislocation.
IMPRESSION: Interval total right hip arthroplasty without evidence of hardware
failure.

## 2022-12-02 IMAGING — RF DG HIP (WITH OR WITHOUT PELVIS) 1V*R*
1 series · 3 of 3 positions shown · non-contrast
Comparison: Radiographs 05/29/2021

CLINICAL DATA: Right total hip arthroplasty.

EXAM:
DG HIP (WITH OR WITHOUT PELVIS) 1V RIGHT

[Series 1: unknown protocol · 0.20mm/px · 3 of 3 slices shown]
[im 1/3]
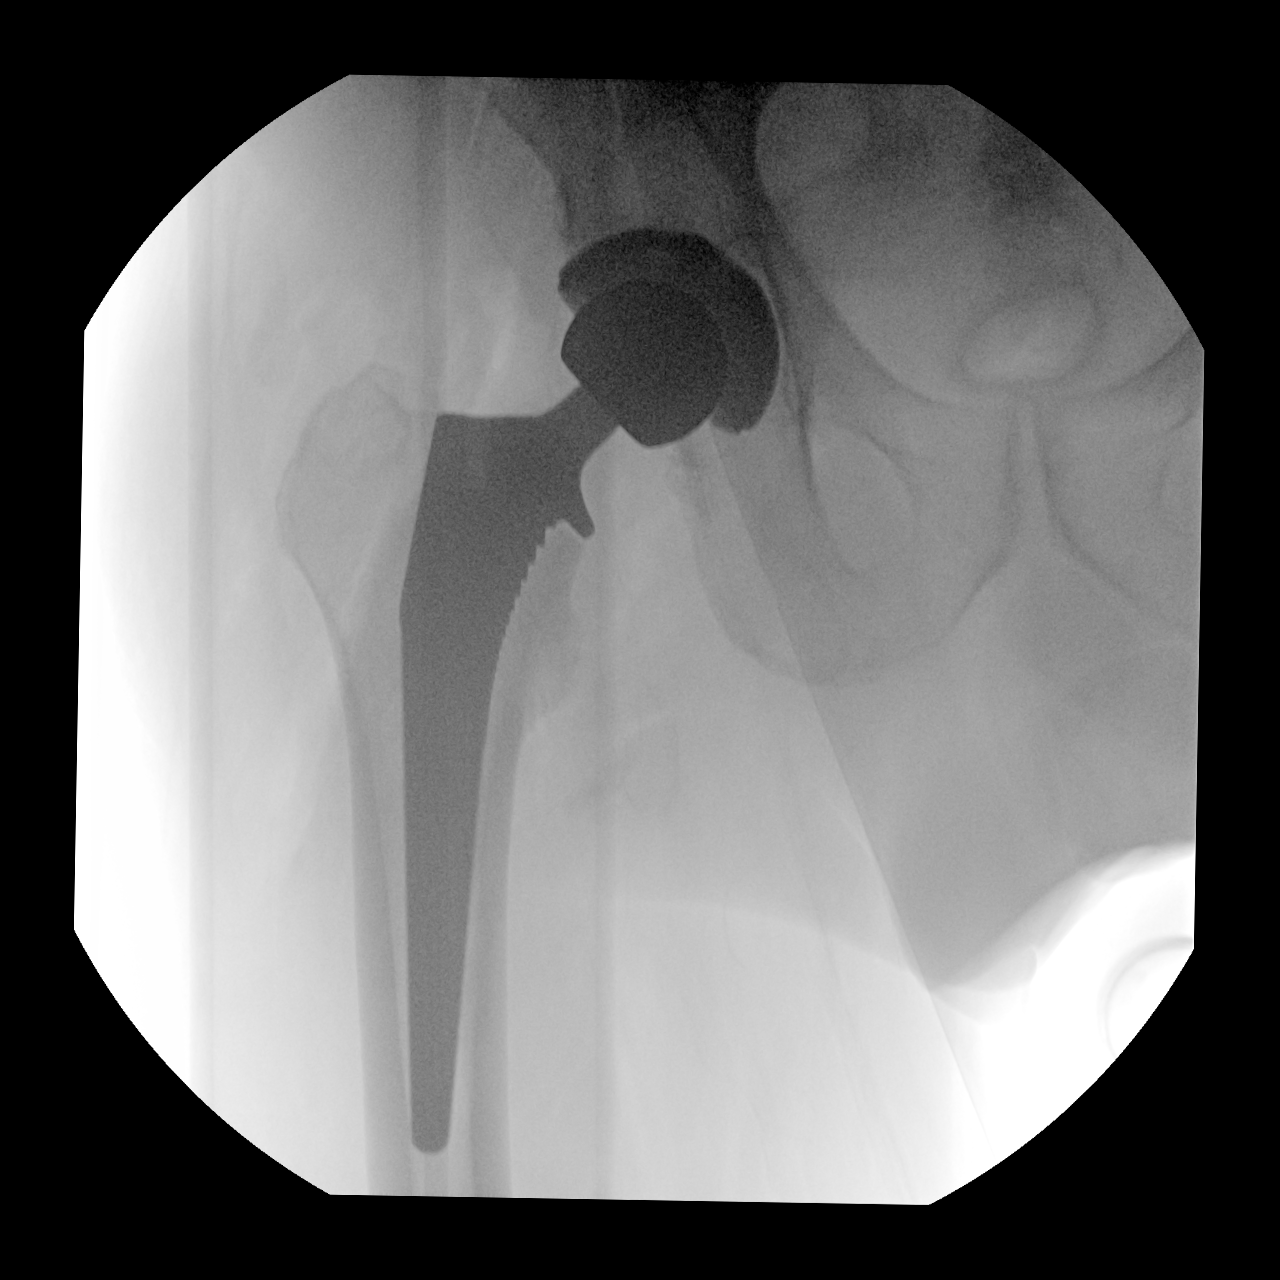
[im 2/3]
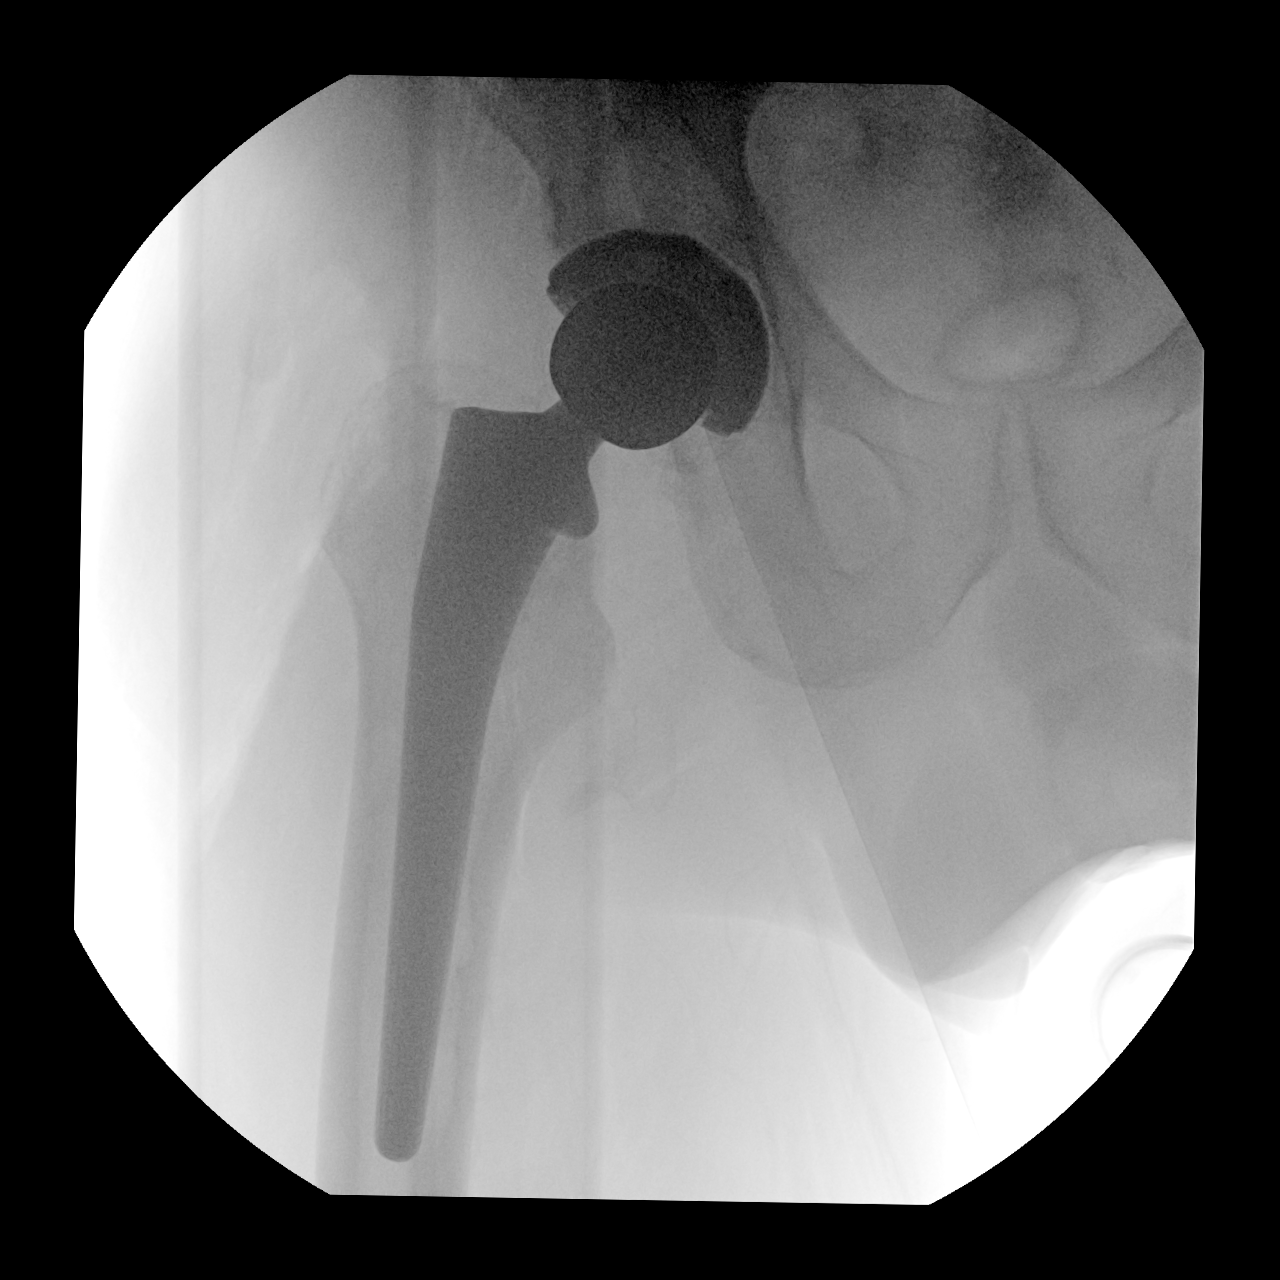
[im 3/3]
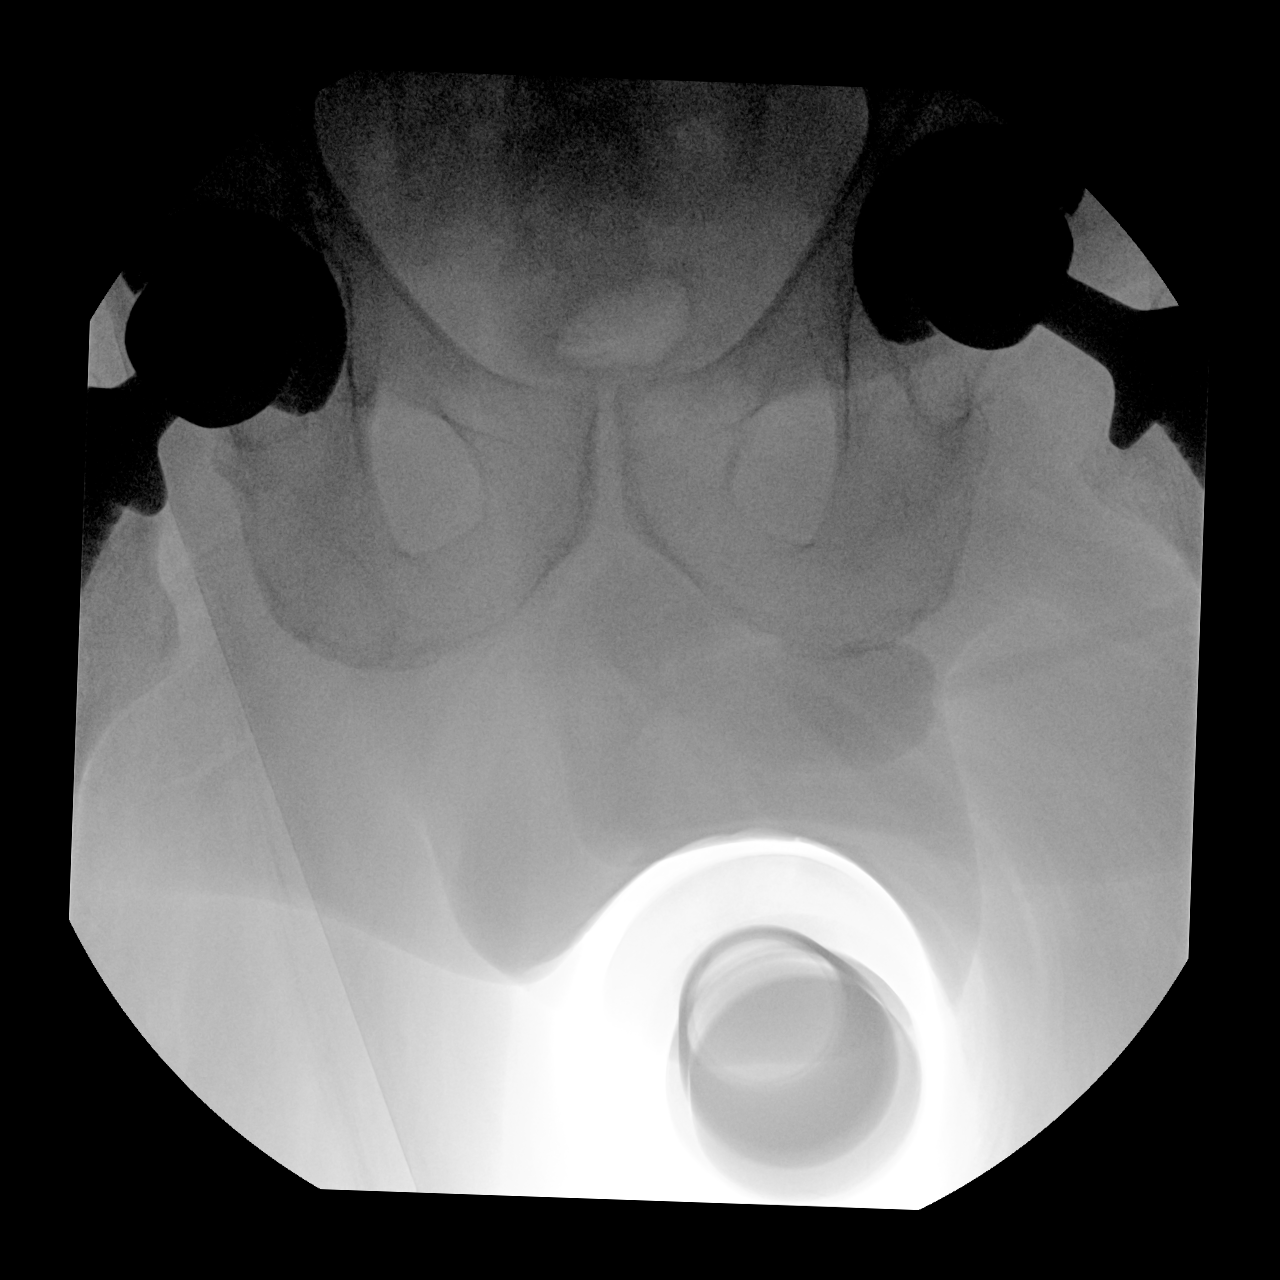

[3 of 3 positions shown; findings below may reference images not displayed]

FINDINGS: Three intraoperative fluoroscopic spot images demonstrate a well
seated right total hip arthroplasty. No complicating features are
identified.
IMPRESSION: Well seated right total hip arthroplasty.

## 2022-12-30 DIAGNOSIS — N401 Enlarged prostate with lower urinary tract symptoms: Secondary | ICD-10-CM | POA: Diagnosis not present

## 2022-12-30 DIAGNOSIS — Z79899 Other long term (current) drug therapy: Secondary | ICD-10-CM | POA: Diagnosis not present

## 2022-12-30 DIAGNOSIS — D519 Vitamin B12 deficiency anemia, unspecified: Secondary | ICD-10-CM | POA: Diagnosis not present

## 2022-12-30 DIAGNOSIS — E782 Mixed hyperlipidemia: Secondary | ICD-10-CM | POA: Diagnosis not present

## 2022-12-30 DIAGNOSIS — N138 Other obstructive and reflux uropathy: Secondary | ICD-10-CM | POA: Diagnosis not present

## 2022-12-30 DIAGNOSIS — I10 Essential (primary) hypertension: Secondary | ICD-10-CM | POA: Diagnosis not present

## 2023-04-02 DIAGNOSIS — D519 Vitamin B12 deficiency anemia, unspecified: Secondary | ICD-10-CM | POA: Diagnosis not present
